# Patient Record
Sex: Male | Born: 1970
Health system: Southern US, Community
[De-identification: ages and names within clinical notes are randomized; demographics above are authoritative.]

## PROBLEM LIST (undated history)

## (undated) DIAGNOSIS — E119 Type 2 diabetes mellitus without complications: Secondary | ICD-10-CM

## (undated) DIAGNOSIS — J302 Other seasonal allergic rhinitis: Secondary | ICD-10-CM

## (undated) DIAGNOSIS — J939 Pneumothorax, unspecified: Secondary | ICD-10-CM

## (undated) DIAGNOSIS — K219 Gastro-esophageal reflux disease without esophagitis: Secondary | ICD-10-CM

## (undated) HISTORY — PX: CYST EXCISION: SHX5701

## (undated) HISTORY — DX: Type 2 diabetes mellitus without complications: E11.9

## (undated) HISTORY — DX: Pneumothorax, unspecified: J93.9

---

## 2015-04-10 ENCOUNTER — Institutional Professional Consult (permissible substitution) (INDEPENDENT_AMBULATORY_CARE_PROVIDER_SITE_OTHER): Payer: Self-pay | Admitting: Cardiothoracic Surgery

## 2015-04-10 ENCOUNTER — Other Ambulatory Visit: Payer: Self-pay | Admitting: Cardiothoracic Surgery

## 2015-04-10 ENCOUNTER — Other Ambulatory Visit: Payer: Self-pay | Admitting: *Deleted

## 2015-04-10 ENCOUNTER — Ambulatory Visit
Admission: RE | Admit: 2015-04-10 | Discharge: 2015-04-10 | Disposition: A | Discharge status: Home / Self Care | Payer: No Typology Code available for payment source | Source: Ambulatory Visit | Attending: Cardiothoracic Surgery | Admitting: Cardiothoracic Surgery

## 2015-04-10 ENCOUNTER — Encounter: Payer: Self-pay | Admitting: Cardiothoracic Surgery

## 2015-04-10 VITALS — BP 140/87 | HR 62 | Resp 20 | Ht 70.0 in | Wt 205.0 lb

## 2015-04-10 DIAGNOSIS — E119 Type 2 diabetes mellitus without complications: Secondary | ICD-10-CM | POA: Insufficient documentation

## 2015-04-10 DIAGNOSIS — J439 Emphysema, unspecified: Secondary | ICD-10-CM

## 2015-04-10 DIAGNOSIS — J9383 Other pneumothorax: Secondary | ICD-10-CM

## 2015-04-10 DIAGNOSIS — J939 Pneumothorax, unspecified: Secondary | ICD-10-CM

## 2015-04-10 MED FILL — ALPRAZolam 1 MG TABS: 1 | 6 days supply | Qty: 12 | Fill #0

## 2015-04-10 NOTE — Progress Notes (Signed)
PCP is Renae Fickle, MD Referring Provider is Marcellus Scott, MD  Chief Complaint  Patient presents with  . Spontaneous Pneumothorax    eval and treat.Marland KitchenMarland KitchenCXR today   Patient examined, recent chest x-rays and CT scan of the chest performed last month personal reviewed and counseled with patient  HPI:45 year old Caucasian male reformed smoker ( quit smoking one pack per day 10 years ago) presents for evaluation of a subacute left pneumothorax. The patient started having left chest sensation of air bubbles /chest discomfort with cough and some exertional shortness of breath. This occurred last fall. Treated with courses of oral antibiotics without improvement. He returned with persistent symptoms and a chest x-ray was performed at an urgent care in January. This showed a left 15-20% pneumothorax. CT scan of the chest was then performed which showed blebs in the lingula of the left upper lobe and some bleb formation in the basilar segment of left lower lobe with a corresponding 15% pneumothorax.mild COPD changes. No discrete pulmonary mass. No abnormal adenopathy. 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 He states several months prior to the onset of symptoms he was cutting down a tree which backlashed him to the ground and  a sidebranch struck him across the back of the neck and shoulder.he did not seek medical care and was back to work 2 days later.  Reviewing the patient's x-rays he has had a persistent 15-20% left pneumothorax for several weeks without improvement, probably related to blebs in the lingula of the left upper lobe which are persistently symptomatic and should be surgically resected.  The patient has had no significant medical issues and takes no medications. Past Medical History  Diagnosis Date  . Diabetes mellitus without complication (HCC)     diet controlled  .  Pneumothorax     Past Surgical History  Procedure Laterality Date  . Cyst excision Right     right upper arm    Family History  Problem Relation Age of Onset  . Cancer Mother     kidney  . Diabetes Father   . Hypertension Father     Social History Social History  Substance Use Topics  . Smoking status: Former Smoker -- 1.00 packs/day for 20 years    Types: Cigarettes    Quit date: 04/09/2005  . Smokeless tobacco: None  . Alcohol Use: No    Current Outpatient Prescriptions  Medication Sig Dispense Refill  . albuterol (PROVENTIL HFA;VENTOLIN HFA) 108 (90 Base) MCG/ACT inhaler Inhale 2 puffs into the lungs every 4 (four) hours as needed for wheezing or shortness of breath.    . fluticasone (FLONASE) 50 MCG/ACT nasal spray Place into both nostrils 2 (two) times daily.     No current facility-administered medications for this visit.    Allergies  Allergen Reactions  . Prednisone Rash    Review of Systems         Review of Systems :  [ y ] = yes, [  ] = no        General :  Weight gain [   ]    Weight loss  [   ]  Fatigue [  ]  Fever [  ]  Chills  [  ]  Weakness  [  ]           Cardiac :  Chest pain/ pressure [ yes left-sided with exertion ]  Resting SOB [  ] exertional SOB [ y ES]                        Orthopnea [  ]  Pedal edema  [  ]  Palpitations [  ] Syncope/presyncope                         Paroxysmal nocturnal dyspnea [  ]        Pulmonary : cough [  ]  wheezing [  ]  Hemoptysis [  ] Sputum [  ] Snoring [  ]                              Pneumothorax [  ]  Sleep apnea [  ]       GI : Vomiting [  ]  Dysphagia [  ]  Melena  [  ]  Abdominal pain [  ] BRBPR [  ]              Heart burn [  ]  Constipation [  ] Diarrhea  [  ] Colonoscopy [  ]       GU : Hematuria [  ]  Dysuria [  ]  Nocturia [  ] UTI's [  ]       Vascular : Claudication [  ]  Rest pain [  ]  DVT [  ] Vein stripping [  ] leg ulcers [  ]                          TIA [   ] Stroke [  ]  Varicose veins [  ]       NEURO :  Headaches  [  ] Seizures [  ] Vision changes [  ] Paresthesias [  ]       Musculoskeletal :  Arthritis [  ] Gout  [  ]  Back pain [  ]  Joint pain [  ]       Skin :  Rash [  ]  Melanoma [  ]        Heme : Bleeding problems [  ]Clotting Disorders [  ] Anemia [  ]Blood Transfusion        Endocrine : Diabetes [  ] Thyroid Disorder  [  ]       Psych : Depression [  ]  Anxiety [  ]  Psych hospitalizations [  ]                                              BP 140/87 mmHg  Pulse 62  Resp 20  Ht  (1.778 m)  Wt 205 lb (92.987 kg)  BMI 29.41 kg/m2  SpO2 98% Physical Exam      Physical Exam  General: alert and pleasant well nourished middle-aged Caucasian male no acute distress HEENT: Normocephalic pupils equal , dentition adequate Neck: Supple without JVD, adenopathy, or bruit Chest: left pleural friction rub easily heard  in the left anterior axillary line the without tenderness or deformity         Cardiovascular: Regular rate and rhythm, no murmur, no gallop, peripheral pulses             palpable in all extremities Abdomen:  Soft, nontender, no palpable mass or organomegaly Extremities: Warm, well-perfused, no clubbing cyanosis edema or tenderness,              no venous stasis changes of the legs Rectal/GU: Deferred Neuro: Grossly non--focal and symmetrical throughout Skin: Clean and dry without rash or ulceration   Diagnostic Tests: CT scan of chest showing the bleb disease in the left upper lobe and left lower lobe discussed and demonstrated to the patient and his son  Impression: Subacute left spontaneous pneumothorax, persistent over several weeks with persistent symptoms.  Plan:left VATS with resection of blebs and pleurodesis is indicated. This will be scheduled for surgery at [facility] next week. I discussed the procedure in detail the patient including indications benefits alternatives and risks. He agrees  to proceed.   Taylor Bussing, MD [facility] [phone]

## 2015-04-14 ENCOUNTER — Other Ambulatory Visit (HOSPITAL_COMMUNITY): Payer: Self-pay | Admitting: *Deleted

## 2015-04-14 NOTE — Pre-Procedure Instructions (Signed)
Taylor [name]  04/14/2015      [name] [name] [place] - [address] [place] [address] Phone: [phone] Fax: [phone]    Your procedure is scheduled on Thursday, April 16, 2015 at 7:30 AM.   Report to [facility] Entrance "A" Admitting Office at 5:30 AM.   Call this number if you have problems the morning of surgery: [phone]    Remember:  Do not eat food or drink liquids after midnight tonight.    Do not wear jewelry.  Do not wear lotions, powders, or cologne.  You may NOT wear deodorant.  Men may shave face and neck.  Do not bring valuables to the hospital.  [facility] is not responsible for any belongings or valuables.  Contacts, dentures or bridgework may not be worn into surgery.  Leave your suitcase in the car.  After surgery it may be brought to your room.  For patients admitted to the hospital, discharge time will be determined by your treatment team.  Special instructions:  [facility] - Preparing for Surgery  Before surgery, you can play an important role.  Because skin is not sterile, your skin needs to be as free of germs as possible.  You can reduce the number of germs on you skin by washing with CHG (chlorahexidine gluconate) soap before surgery.  CHG is an antiseptic cleaner which kills germs and bonds with the skin to continue killing germs even after washing.  Please DO NOT use if you have an allergy to CHG or antibacterial soaps.  If your skin becomes reddened/irritated stop using the CHG and inform your nurse when you arrive at Short Stay.  Do not shave (including legs and underarms) for at least 48 hours prior to the first CHG shower.  You may shave your face.  Please follow these instructions carefully:   1.  Shower with CHG Soap the night before surgery and the                                morning of Surgery.  2.  If you choose to wash your hair, wash your hair first as usual  with your       normal shampoo.  3.  After you shampoo, rinse your hair and body thoroughly to remove the                      Shampoo.  4.  Use CHG as you would any other liquid soap.  You can apply chg directly       to the skin and wash gently with scrungie or a clean washcloth.  5.  Apply the CHG Soap to your body ONLY FROM THE NECK DOWN.        Do not use on open wounds or open sores.  Avoid contact with your eyes, ears, mouth and genitals (private parts).  Wash genitals (private parts) with your normal soap.  6.  Wash thoroughly, paying special attention to the area where your surgery        will be performed.  7.  Thoroughly rinse your body with warm water from the neck down.  8.  DO NOT shower/wash with your normal soap after using and rinsing off       the CHG Soap.  9.  Pat yourself dry with a clean  towel.            10.  Wear clean pajamas.            11.  Place clean sheets on your bed the night of your first shower and do not        sleep with pets.  Day of Surgery  Do not apply any lotions/deodorants the morning of surgery.  Please wear clean clothes to the hospital.   Please read over the following fact sheets that you were given. Pain Booklet, Coughing and Deep Breathing, Blood Transfusion Information, MRSA Information and Surgical Site Infection Prevention

## 2015-04-15 ENCOUNTER — Encounter (HOSPITAL_COMMUNITY)
Admission: RE | Admit: 2015-04-15 | Discharge: 2015-04-15 | Disposition: A | Discharge status: Home / Self Care | Payer: Self-pay | Source: Ambulatory Visit | Attending: Cardiothoracic Surgery | Admitting: Cardiothoracic Surgery

## 2015-04-15 ENCOUNTER — Ambulatory Visit (HOSPITAL_COMMUNITY)
Admission: RE | Admit: 2015-04-15 | Discharge: 2015-04-15 | Disposition: A | Discharge status: Home / Self Care | Payer: Self-pay | Source: Ambulatory Visit | Attending: Cardiothoracic Surgery | Admitting: Cardiothoracic Surgery

## 2015-04-15 ENCOUNTER — Encounter (HOSPITAL_COMMUNITY): Payer: Self-pay

## 2015-04-15 VITALS — BP 127/76 | HR 74 | Temp 98.6°F | Resp 20 | Ht 70.0 in | Wt 205.9 lb

## 2015-04-15 DIAGNOSIS — Z01812 Encounter for preprocedural laboratory examination: Secondary | ICD-10-CM | POA: Insufficient documentation

## 2015-04-15 DIAGNOSIS — J939 Pneumothorax, unspecified: Secondary | ICD-10-CM

## 2015-04-15 DIAGNOSIS — Z01810 Encounter for preprocedural cardiovascular examination: Secondary | ICD-10-CM | POA: Insufficient documentation

## 2015-04-15 DIAGNOSIS — Z01818 Encounter for other preprocedural examination: Secondary | ICD-10-CM | POA: Insufficient documentation

## 2015-04-15 DIAGNOSIS — R001 Bradycardia, unspecified: Secondary | ICD-10-CM | POA: Insufficient documentation

## 2015-04-15 DIAGNOSIS — J439 Emphysema, unspecified: Secondary | ICD-10-CM

## 2015-04-15 HISTORY — DX: Gastro-esophageal reflux disease without esophagitis: K21.9

## 2015-04-15 HISTORY — DX: Other seasonal allergic rhinitis: J30.2

## 2015-04-15 LAB — COMPREHENSIVE METABOLIC PANEL WITH GFR
ALT: 28 U/L (ref 17–63)
AST: 22 U/L (ref 15–41)
Albumin: 3.9 g/dL (ref 3.5–5.0)
Alkaline Phosphatase: 95 U/L (ref 38–126)
Anion gap: 10 (ref 5–15)
BUN: 14 mg/dL (ref 6–20)
CO2: 20 mmol/L — ABNORMAL LOW (ref 22–32)
Calcium: 8.9 mg/dL (ref 8.9–10.3)
Chloride: 106 mmol/L (ref 101–111)
Creatinine, Ser: 0.92 mg/dL (ref 0.61–1.24)
GFR calc Af Amer: >60 mL/min
GFR calc non Af Amer: >60 mL/min
Glucose, Bld: 231 mg/dL — ABNORMAL HIGH (ref 65–99)
Potassium: 4.3 mmol/L (ref 3.5–5.1)
Sodium: 136 mmol/L (ref 135–145)
Total Bilirubin: 0.8 mg/dL (ref 0.3–1.2)
Total Protein: 7.3 g/dL (ref 6.5–8.1)

## 2015-04-15 LAB — BLOOD GAS, ARTERIAL
Acid-base deficit: 2.2 mmol/L — ABNORMAL HIGH (ref 0.0–2.0)
Bicarbonate: 21.7 mEq/L (ref 20.0–24.0)
O2 Saturation: 96.7 %
Patient temperature: 98.6
TCO2: 22.8 mmol/L (ref 0–100)
pCO2 arterial: 35 mmHg (ref 35.0–45.0)
pH, Arterial: 7.409 (ref 7.350–7.450)
pO2, Arterial: 88.9 mmHg (ref 80.0–100.0)

## 2015-04-15 LAB — TYPE AND SCREEN
ABO/RH(D): A POS
Antibody Screen: NEGATIVE

## 2015-04-15 LAB — PROTIME-INR
INR: 1.04 (ref 0.00–1.49)
Prothrombin Time: 13.8 seconds (ref 11.6–15.2)

## 2015-04-15 LAB — APTT: aPTT: 29 s (ref 24–37)

## 2015-04-15 LAB — URINALYSIS, ROUTINE W REFLEX MICROSCOPIC
Bilirubin Urine: NEGATIVE
Glucose, UA: >1000 mg/dL — AB
Hgb urine dipstick: NEGATIVE
Ketones, ur: NEGATIVE mg/dL
Leukocytes, UA: NEGATIVE
Nitrite: NEGATIVE
Protein, ur: NEGATIVE mg/dL
Specific Gravity, Urine: 1.029 (ref 1.005–1.030)
pH: 5.5 (ref 5.0–8.0)

## 2015-04-15 LAB — URINE MICROSCOPIC-ADD ON
Bacteria, UA: NONE SEEN
RBC / HPF: NONE SEEN RBC/hpf (ref 0–5)
Squamous Epithelial / LPF: NONE SEEN
WBC, UA: NONE SEEN WBC/hpf (ref 0–5)

## 2015-04-15 LAB — CBC
HCT: 45.8 % (ref 39.0–52.0)
Hemoglobin: 15.5 g/dL (ref 13.0–17.0)
MCH: 28.6 pg (ref 26.0–34.0)
MCHC: 33.8 g/dL (ref 30.0–36.0)
MCV: 84.5 fL (ref 78.0–100.0)
Platelets: 278 10*3/uL (ref 150–400)
RBC: 5.42 MIL/uL (ref 4.22–5.81)
RDW: 12.7 % (ref 11.5–15.5)
WBC: 7.8 10*3/uL (ref 4.0–10.5)

## 2015-04-15 LAB — GLUCOSE, CAPILLARY: Glucose-Capillary: 224 mg/dL — ABNORMAL HIGH (ref 65–99)

## 2015-04-15 LAB — ABO/RH: ABO/RH(D): A POS

## 2015-04-15 LAB — SURGICAL PCR SCREEN
MRSA, PCR: NEGATIVE
Staphylococcus aureus: NEGATIVE

## 2015-04-15 MED ORDER — DEXTROSE 5 % IV SOLN
1.5000 g | INTRAVENOUS | Status: AC
  Administered 2015-04-16: 1.5 g via INTRAVENOUS
  Filled 2015-04-15 (×2): qty 1.5

## 2015-04-15 NOTE — Progress Notes (Addendum)
Patient's PCP is Renae Fickle, MD in [place].  Patient denies ever having any cardiac testing done or ever seeing a cardiologist for any reason.  Patient is a diabetic (type 2) but states it is diet controlled.  CBG in PAT was 224.  He states his sugars are normally in the 100s when he checks it first thing in the morning. A1C pending.

## 2015-04-15 NOTE — Progress Notes (Signed)
   04/15/15 0818  OBSTRUCTIVE SLEEP APNEA  Have you ever been diagnosed with sleep apnea through a sleep study? No  Do you snore loudly (loud enough to be heard through closed doors)?  1  Do you often feel tired, fatigued, or sleepy during the daytime (such as falling asleep during driving or talking to someone)? 1  Has anyone observed you stop breathing during your sleep? 1  Do you have, or are you being treated for high blood pressure? 0  BMI more than 35 kg/m2? 0  Age > 50 (1-yes) 0  Neck circumference greater than:Male 16 inches or larger, Male 17inches or larger? 1 (66.5)  Male Gender (Yes=1) 1  Obstructive Sleep Apnea Score 5  Score 5 or greater  Results sent to PCP

## 2015-04-16 ENCOUNTER — Inpatient Hospital Stay (HOSPITAL_COMMUNITY)
Admission: RE | Admit: 2015-04-16 | Discharge: 2015-04-19 | DRG: 164 | Disposition: A | Discharge status: Home / Self Care | Payer: Self-pay | Source: Ambulatory Visit | Attending: Cardiothoracic Surgery | Admitting: Cardiothoracic Surgery

## 2015-04-16 ENCOUNTER — Inpatient Hospital Stay (HOSPITAL_COMMUNITY): Payer: Self-pay | Admitting: Anesthesiology

## 2015-04-16 ENCOUNTER — Encounter (HOSPITAL_COMMUNITY)
Admission: RE | Disposition: A | Discharge status: Home / Self Care | Payer: Self-pay | Source: Ambulatory Visit | Attending: Cardiothoracic Surgery

## 2015-04-16 ENCOUNTER — Inpatient Hospital Stay (HOSPITAL_COMMUNITY): Payer: Self-pay

## 2015-04-16 ENCOUNTER — Encounter (HOSPITAL_COMMUNITY): Payer: Self-pay | Admitting: Certified Registered"

## 2015-04-16 DIAGNOSIS — J9383 Other pneumothorax: Principal | ICD-10-CM | POA: Diagnosis present

## 2015-04-16 DIAGNOSIS — J939 Pneumothorax, unspecified: Secondary | ICD-10-CM | POA: Diagnosis present

## 2015-04-16 DIAGNOSIS — R0602 Shortness of breath: Secondary | ICD-10-CM

## 2015-04-16 DIAGNOSIS — J9311 Primary spontaneous pneumothorax: Secondary | ICD-10-CM

## 2015-04-16 DIAGNOSIS — F419 Anxiety disorder, unspecified: Secondary | ICD-10-CM | POA: Diagnosis present

## 2015-04-16 DIAGNOSIS — J439 Emphysema, unspecified: Secondary | ICD-10-CM | POA: Diagnosis present

## 2015-04-16 DIAGNOSIS — J9811 Atelectasis: Secondary | ICD-10-CM | POA: Diagnosis not present

## 2015-04-16 DIAGNOSIS — J90 Pleural effusion, not elsewhere classified: Secondary | ICD-10-CM | POA: Diagnosis not present

## 2015-04-16 DIAGNOSIS — E119 Type 2 diabetes mellitus without complications: Secondary | ICD-10-CM | POA: Diagnosis present

## 2015-04-16 DIAGNOSIS — Z87891 Personal history of nicotine dependence: Secondary | ICD-10-CM

## 2015-04-16 DIAGNOSIS — R238 Other skin changes: Secondary | ICD-10-CM | POA: Diagnosis present

## 2015-04-16 HISTORY — PX: VIDEO ASSISTED THORACOSCOPY: SHX5073

## 2015-04-16 HISTORY — PX: TALC PLEURODESIS: SHX2506

## 2015-04-16 HISTORY — PX: STAPLING OF BLEBS: SHX6429

## 2015-04-16 LAB — GLUCOSE, CAPILLARY
Glucose-Capillary: 178 mg/dL — ABNORMAL HIGH (ref 65–99)
Glucose-Capillary: 189 mg/dL — ABNORMAL HIGH (ref 65–99)

## 2015-04-16 LAB — HEMOGLOBIN A1C
Hgb A1c MFr Bld: 8.2 % — ABNORMAL HIGH (ref 4.8–5.6)
Mean Plasma Glucose: 189 mg/dL

## 2015-04-16 SURGERY — VIDEO ASSISTED THORACOSCOPY
Anesthesia: General | Site: Chest | Laterality: Left

## 2015-04-16 MED ORDER — LIDOCAINE HCL (CARDIAC) 20 MG/ML IV SOLN
INTRAVENOUS | Status: AC
  Filled 2015-04-16: qty 5

## 2015-04-16 MED ORDER — SENNOSIDES-DOCUSATE SODIUM 8.6-50 MG PO TABS
1.0000 | ORAL_TABLET | Freq: Every day | ORAL | Status: DC
  Administered 2015-04-16 – 2015-04-18 (×3): 1 via ORAL
  Filled 2015-04-16 (×3): qty 1

## 2015-04-16 MED ORDER — MIDAZOLAM HCL 5 MG/5ML IJ SOLN
INTRAMUSCULAR | Status: DC | PRN
Start: 2015-04-16 — End: 2015-04-16
  Administered 2015-04-16: 2 mg via INTRAVENOUS

## 2015-04-16 MED ORDER — 0.9 % SODIUM CHLORIDE (POUR BTL) OPTIME
TOPICAL | Status: DC | PRN
  Administered 2015-04-16: 2000 mL

## 2015-04-16 MED ORDER — POTASSIUM CHLORIDE 10 MEQ/50ML IV SOLN: 10.0000 meq | Freq: Every day | INTRAVENOUS | Status: DC | PRN

## 2015-04-16 MED ORDER — PROMETHAZINE HCL 25 MG/ML IJ SOLN: 6.2500 mg | INTRAMUSCULAR | Status: DC | PRN

## 2015-04-16 MED ORDER — SUCCINYLCHOLINE CHLORIDE 20 MG/ML IJ SOLN
INTRAMUSCULAR | Status: AC
  Filled 2015-04-16: qty 1

## 2015-04-16 MED ORDER — INFLUENZA VAC SPLIT QUAD 0.5 ML IM SUSY
0.5000 mL | PREFILLED_SYRINGE | INTRAMUSCULAR | Status: DC
  Filled 2015-04-16 (×2): qty 0.5

## 2015-04-16 MED ORDER — LUNG SURGERY BOOK
Freq: Once | Status: AC
  Administered 2015-04-16: 1
  Filled 2015-04-16: qty 1

## 2015-04-16 MED ORDER — TRAMADOL HCL 50 MG PO TABS
50.0000 mg | ORAL_TABLET | Freq: Four times a day (QID) | ORAL | Status: DC | PRN
  Administered 2015-04-16 – 2015-04-17 (×2): 100 mg via ORAL
  Filled 2015-04-16: qty 2

## 2015-04-16 MED ORDER — EPHEDRINE SULFATE 50 MG/ML IJ SOLN
INTRAMUSCULAR | Status: DC | PRN
  Administered 2015-04-16 (×2): 10 mg via INTRAVENOUS

## 2015-04-16 MED ORDER — FENTANYL CITRATE (PF) 250 MCG/5ML IJ SOLN
INTRAMUSCULAR | Status: AC
  Filled 2015-04-16: qty 5

## 2015-04-16 MED ORDER — LIDOCAINE HCL (CARDIAC) 20 MG/ML IV SOLN
INTRAVENOUS | Status: DC | PRN
  Administered 2015-04-16: 80 mg via INTRAVENOUS

## 2015-04-16 MED ORDER — EPHEDRINE SULFATE 50 MG/ML IJ SOLN
INTRAMUSCULAR | Status: AC
  Filled 2015-04-16: qty 1

## 2015-04-16 MED ORDER — KETOROLAC TROMETHAMINE 30 MG/ML IJ SOLN
INTRAMUSCULAR | Status: AC
  Administered 2015-04-16: 30 mg via INTRAVENOUS
  Filled 2015-04-16: qty 1

## 2015-04-16 MED ORDER — DIPHENHYDRAMINE HCL 50 MG/ML IJ SOLN: 12.5000 mg | Freq: Four times a day (QID) | INTRAMUSCULAR | Status: DC | PRN

## 2015-04-16 MED ORDER — MIDAZOLAM HCL 2 MG/2ML IJ SOLN
INTRAMUSCULAR | Status: AC
  Filled 2015-04-16: qty 2

## 2015-04-16 MED ORDER — NALOXONE HCL 0.4 MG/ML IJ SOLN: 0.4000 mg | INTRAMUSCULAR | Status: DC | PRN

## 2015-04-16 MED ORDER — HYDROMORPHONE HCL 1 MG/ML IJ SOLN
INTRAMUSCULAR | Status: AC
  Filled 2015-04-16: qty 1

## 2015-04-16 MED ORDER — KETOROLAC TROMETHAMINE 30 MG/ML IJ SOLN
30.0000 mg | Freq: Once | INTRAMUSCULAR | Status: AC
  Administered 2015-04-16: 30 mg via INTRAVENOUS

## 2015-04-16 MED ORDER — DIPHENHYDRAMINE HCL 12.5 MG/5ML PO ELIX
12.5000 mg | ORAL_SOLUTION | Freq: Four times a day (QID) | ORAL | Status: DC | PRN
  Administered 2015-04-17: 12.5 mg via ORAL
  Filled 2015-04-16: qty 5

## 2015-04-16 MED ORDER — ROCURONIUM BROMIDE 100 MG/10ML IV SOLN
INTRAVENOUS | Status: DC | PRN
  Administered 2015-04-16: 10 mg via INTRAVENOUS
  Administered 2015-04-16: 30 mg via INTRAVENOUS
  Administered 2015-04-16: 40 mg via INTRAVENOUS
  Administered 2015-04-16: 10 mg via INTRAVENOUS

## 2015-04-16 MED ORDER — ONDANSETRON HCL 4 MG/2ML IJ SOLN
4.0000 mg | Freq: Four times a day (QID) | INTRAMUSCULAR | Status: DC | PRN
  Administered 2015-04-17: 4 mg via INTRAVENOUS
  Filled 2015-04-16: qty 2

## 2015-04-16 MED ORDER — PROPOFOL 10 MG/ML IV BOLUS
INTRAVENOUS | Status: AC
  Filled 2015-04-16: qty 20

## 2015-04-16 MED ORDER — ROCURONIUM BROMIDE 50 MG/5ML IV SOLN
INTRAVENOUS | Status: AC
  Filled 2015-04-16: qty 1

## 2015-04-16 MED ORDER — ACETAMINOPHEN 160 MG/5ML PO SOLN: 1000.0000 mg | Freq: Four times a day (QID) | ORAL | Status: DC

## 2015-04-16 MED ORDER — ALBUTEROL SULFATE (2.5 MG/3ML) 0.083% IN NEBU
2.5000 mg | INHALATION_SOLUTION | RESPIRATORY_TRACT | Status: DC
  Administered 2015-04-16 (×2): 2.5 mg via RESPIRATORY_TRACT
  Filled 2015-04-16 (×2): qty 3

## 2015-04-16 MED ORDER — HEMOSTATIC AGENTS (NO CHARGE) OPTIME
TOPICAL | Status: DC | PRN
  Administered 2015-04-16: 1 via TOPICAL

## 2015-04-16 MED ORDER — ONDANSETRON HCL 4 MG/2ML IJ SOLN
INTRAMUSCULAR | Status: DC | PRN
  Administered 2015-04-16: 4 mg via INTRAVENOUS

## 2015-04-16 MED ORDER — TALC 5 G PL SUSR
INTRAPLEURAL | Status: DC | PRN
  Administered 2015-04-16: 5 g via INTRAPLEURAL

## 2015-04-16 MED ORDER — ALBUTEROL SULFATE (2.5 MG/3ML) 0.083% IN NEBU
2.5000 mg | INHALATION_SOLUTION | RESPIRATORY_TRACT | Status: DC
  Administered 2015-04-17: 2.5 mg via RESPIRATORY_TRACT
  Filled 2015-04-16: qty 3

## 2015-04-16 MED ORDER — OXYCODONE HCL 5 MG PO TABS
5.0000 mg | ORAL_TABLET | ORAL | Status: DC | PRN
  Administered 2015-04-16: 10 mg via ORAL
  Administered 2015-04-19: 5 mg via ORAL
  Filled 2015-04-16: qty 1

## 2015-04-16 MED ORDER — KETOROLAC TROMETHAMINE 30 MG/ML IJ SOLN
30.0000 mg | Freq: Four times a day (QID) | INTRAMUSCULAR | Status: AC
  Administered 2015-04-16 – 2015-04-17 (×4): 30 mg via INTRAVENOUS
  Filled 2015-04-16 (×4): qty 1

## 2015-04-16 MED ORDER — ONDANSETRON HCL 4 MG/2ML IJ SOLN: 4.0000 mg | Freq: Four times a day (QID) | INTRAMUSCULAR | Status: DC | PRN

## 2015-04-16 MED ORDER — SUGAMMADEX SODIUM 200 MG/2ML IV SOLN
INTRAVENOUS | Status: DC | PRN
  Administered 2015-04-16: 200 mg via INTRAVENOUS

## 2015-04-16 MED ORDER — DEXTROSE-NACL 5-0.9 % IV SOLN
INTRAVENOUS | Status: DC
  Administered 2015-04-16 – 2015-04-17 (×2): via INTRAVENOUS

## 2015-04-16 MED ORDER — FENTANYL 40 MCG/ML IV SOLN
INTRAVENOUS | Status: AC
  Filled 2015-04-16: qty 25

## 2015-04-16 MED ORDER — PNEUMOCOCCAL VAC POLYVALENT 25 MCG/0.5ML IJ INJ
0.5000 mL | INJECTION | INTRAMUSCULAR | Status: DC
  Filled 2015-04-16 (×2): qty 0.5

## 2015-04-16 MED ORDER — FENTANYL 40 MCG/ML IV SOLN
INTRAVENOUS | Status: DC
  Administered 2015-04-16: 230 ug via INTRAVENOUS
  Administered 2015-04-16: 11:00:00 via INTRAVENOUS
  Administered 2015-04-16: 15 ug via INTRAVENOUS
  Administered 2015-04-16: 180 ug via INTRAVENOUS
  Administered 2015-04-17: 150 ug via INTRAVENOUS
  Administered 2015-04-17: 360 ug via INTRAVENOUS
  Administered 2015-04-17: 210 ug via INTRAVENOUS
  Administered 2015-04-17: 10:00:00 via INTRAVENOUS
  Administered 2015-04-17: 75 ug via INTRAVENOUS
  Administered 2015-04-18: 60 ug via INTRAVENOUS
  Administered 2015-04-18 (×3): 120 ug via INTRAVENOUS
  Administered 2015-04-19: 195 ug via INTRAVENOUS
  Administered 2015-04-19: 30 ug via INTRAVENOUS
  Administered 2015-04-19: 195 ug via INTRAVENOUS
  Filled 2015-04-16 (×2): qty 25

## 2015-04-16 MED ORDER — HYDROMORPHONE HCL 1 MG/ML IJ SOLN
0.5000 mg | INTRAMUSCULAR | Status: AC | PRN
  Administered 2015-04-16 (×4): 0.5 mg via INTRAVENOUS

## 2015-04-16 MED ORDER — DEXTROSE 5 % IV SOLN
1.5000 g | Freq: Two times a day (BID) | INTRAVENOUS | Status: AC
  Administered 2015-04-16 – 2015-04-17 (×2): 1.5 g via INTRAVENOUS
  Filled 2015-04-16 (×2): qty 1.5

## 2015-04-16 MED ORDER — FENTANYL CITRATE (PF) 100 MCG/2ML IJ SOLN
INTRAMUSCULAR | Status: AC
  Administered 2015-04-16: 50 ug via INTRAVENOUS
  Filled 2015-04-16: qty 2

## 2015-04-16 MED ORDER — TALC 5 G PL SUSR
INTRAPLEURAL | Status: AC
  Filled 2015-04-16: qty 5

## 2015-04-16 MED ORDER — SODIUM CHLORIDE 0.9% FLUSH: 9.0000 mL | INTRAVENOUS | Status: DC | PRN

## 2015-04-16 MED ORDER — OXYCODONE HCL 5 MG PO TABS
ORAL_TABLET | ORAL | Status: AC
  Filled 2015-04-16: qty 2

## 2015-04-16 MED ORDER — ACETAMINOPHEN 500 MG PO TABS
1000.0000 mg | ORAL_TABLET | Freq: Four times a day (QID) | ORAL | Status: DC
  Administered 2015-04-16 – 2015-04-19 (×11): 1000 mg via ORAL
  Filled 2015-04-16 (×11): qty 2

## 2015-04-16 MED ORDER — FENTANYL CITRATE (PF) 100 MCG/2ML IJ SOLN
25.0000 ug | INTRAMUSCULAR | Status: DC | PRN
  Administered 2015-04-16 (×3): 50 ug via INTRAVENOUS

## 2015-04-16 MED ORDER — LACTATED RINGERS IV SOLN
INTRAVENOUS | Status: DC | PRN
  Administered 2015-04-16: 08:00:00 via INTRAVENOUS

## 2015-04-16 MED ORDER — FENTANYL CITRATE (PF) 100 MCG/2ML IJ SOLN
INTRAMUSCULAR | Status: DC | PRN
  Administered 2015-04-16 (×2): 50 ug via INTRAVENOUS
  Administered 2015-04-16: 200 ug via INTRAVENOUS
  Administered 2015-04-16 (×2): 50 ug via INTRAVENOUS

## 2015-04-16 MED ORDER — MEPERIDINE HCL 25 MG/ML IJ SOLN: 6.2500 mg | INTRAMUSCULAR | Status: DC | PRN

## 2015-04-16 MED ORDER — PROPOFOL 10 MG/ML IV BOLUS
INTRAVENOUS | Status: DC | PRN
Start: 2015-04-16 — End: 2015-04-16
  Administered 2015-04-16: 200 mg via INTRAVENOUS

## 2015-04-16 MED ORDER — TRAMADOL HCL 50 MG PO TABS
ORAL_TABLET | ORAL | Status: AC
  Filled 2015-04-16: qty 2

## 2015-04-16 MED ORDER — BISACODYL 5 MG PO TBEC
10.0000 mg | DELAYED_RELEASE_TABLET | Freq: Every day | ORAL | Status: DC
  Administered 2015-04-17 – 2015-04-18 (×2): 10 mg via ORAL
  Filled 2015-04-16 (×3): qty 2

## 2015-04-16 MED ORDER — LACTATED RINGERS IV SOLN: INTRAVENOUS | Status: DC

## 2015-04-16 MED ORDER — FENTANYL CITRATE (PF) 100 MCG/2ML IJ SOLN
INTRAMUSCULAR | Status: AC
  Filled 2015-04-16: qty 2

## 2015-04-16 MED ORDER — SODIUM CHLORIDE 0.9 % IJ SOLN
INTRAMUSCULAR | Status: AC
  Filled 2015-04-16: qty 10

## 2015-04-16 MED ORDER — PHENYLEPHRINE 40 MCG/ML (10ML) SYRINGE FOR IV PUSH (FOR BLOOD PRESSURE SUPPORT)
PREFILLED_SYRINGE | INTRAVENOUS | Status: AC
  Filled 2015-04-16: qty 10

## 2015-04-16 MED ORDER — HYDROMORPHONE HCL 1 MG/ML IJ SOLN
INTRAMUSCULAR | Status: AC
  Administered 2015-04-16: 0.5 mg via INTRAVENOUS
  Filled 2015-04-16: qty 2

## 2015-04-16 SURGICAL SUPPLY — 67 items
APPLICATOR TIP EXT COSEAL (VASCULAR PRODUCTS) ×4 IMPLANT
BAG DECANTER FOR FLEXI CONT (MISCELLANEOUS) IMPLANT
BLADE SURG 11 STRL SS (BLADE) ×4 IMPLANT
CANISTER SUCTION 2500CC (MISCELLANEOUS) ×4 IMPLANT
CATH KIT ON Q 5IN SLV (PAIN MANAGEMENT) IMPLANT
CATH ROBINSON RED A/P 22FR (CATHETERS) IMPLANT
CATH THORACIC 28FR (CATHETERS) ×4 IMPLANT
CATH THORACIC 36FR (CATHETERS) IMPLANT
CATH THORACIC 36FR RT ANG (CATHETERS) IMPLANT
CLEANER TIP ELECTROSURG 2X2 (MISCELLANEOUS) ×4 IMPLANT
CONT SPEC 4OZ CLIKSEAL STRL BL (MISCELLANEOUS) ×8 IMPLANT
COVER SURGICAL LIGHT HANDLE (MISCELLANEOUS) ×8 IMPLANT
DERMABOND ADVANCED (GAUZE/BANDAGES/DRESSINGS) ×2
DERMABOND ADVANCED .7 DNX12 (GAUZE/BANDAGES/DRESSINGS) ×2 IMPLANT
DRAPE LAPAROSCOPIC ABDOMINAL (DRAPES) ×4 IMPLANT
DRAPE WARM FLUID 44X44 (DRAPE) ×4 IMPLANT
ELECT REM PT RETURN 9FT ADLT (ELECTROSURGICAL) ×4
ELECTRODE REM PT RTRN 9FT ADLT (ELECTROSURGICAL) ×2 IMPLANT
GAUZE SPONGE 4X4 12PLY STRL (GAUZE/BANDAGES/DRESSINGS) ×4 IMPLANT
GLOVE BIO SURGEON STRL SZ7 (GLOVE) ×12 IMPLANT
GLOVE BIO SURGEON STRL SZ7.5 (GLOVE) ×8 IMPLANT
GOWN STRL REUS W/ TWL LRG LVL3 (GOWN DISPOSABLE) ×6 IMPLANT
GOWN STRL REUS W/TWL LRG LVL3 (GOWN DISPOSABLE) ×6
KIT BASIN OR (CUSTOM PROCEDURE TRAY) ×4 IMPLANT
KIT ROOM TURNOVER OR (KITS) ×4 IMPLANT
KIT SUCTION CATH 14FR (SUCTIONS) ×4 IMPLANT
NS IRRIG 1000ML POUR BTL (IV SOLUTION) ×8 IMPLANT
PACK CHEST (CUSTOM PROCEDURE TRAY) ×4 IMPLANT
PAD ARMBOARD 7.5X6 YLW CONV (MISCELLANEOUS) ×8 IMPLANT
RELOAD STAPLER GOLD 60MM (STAPLE) ×6 IMPLANT
SEALANT SURG COSEAL 4ML (VASCULAR PRODUCTS) IMPLANT
SEALANT SURG COSEAL 8ML (VASCULAR PRODUCTS) ×4 IMPLANT
SOLUTION ANTI FOG 6CC (MISCELLANEOUS) ×4 IMPLANT
SPONGE GAUZE 4X4 12PLY STER LF (GAUZE/BANDAGES/DRESSINGS) ×4 IMPLANT
SPONGE TONSIL 1 RF SGL (DISPOSABLE) ×4 IMPLANT
STAPLE ECHEON FLEX 60 POW ENDO (STAPLE) ×4 IMPLANT
STAPLER RELOAD GOLD 60MM (STAPLE) ×12
SUT CHROMIC 3 0 SH 27 (SUTURE) IMPLANT
SUT ETHILON 3 0 PS 1 (SUTURE) IMPLANT
SUT PROLENE 3 0 SH DA (SUTURE) IMPLANT
SUT PROLENE 4 0 RB 1 (SUTURE)
SUT PROLENE 4-0 RB1 .5 CRCL 36 (SUTURE) IMPLANT
SUT SILK  1 MH (SUTURE) ×4
SUT SILK 1 MH (SUTURE) ×4 IMPLANT
SUT SILK 2 0SH CR/8 30 (SUTURE) IMPLANT
SUT SILK 3 0SH CR/8 30 (SUTURE) IMPLANT
SUT VIC AB 1 CTX 18 (SUTURE) IMPLANT
SUT VIC AB 2 TP1 27 (SUTURE) IMPLANT
SUT VIC AB 2-0 CT1 27 (SUTURE) ×4
SUT VIC AB 2-0 CT1 TAPERPNT 27 (SUTURE) ×4 IMPLANT
SUT VIC AB 2-0 CT2 18 VCP726D (SUTURE) IMPLANT
SUT VIC AB 2-0 CTX 36 (SUTURE) IMPLANT
SUT VIC AB 3-0 SH 18 (SUTURE) IMPLANT
SUT VIC AB 3-0 X1 27 (SUTURE) ×8 IMPLANT
SUT VICRYL 0 UR6 27IN ABS (SUTURE) ×8 IMPLANT
SUT VICRYL 2 TP 1 (SUTURE) IMPLANT
SWAB COLLECTION DEVICE MRSA (MISCELLANEOUS) IMPLANT
SYR BULB IRRIGATION 50ML (SYRINGE) ×4 IMPLANT
SYSTEM SAHARA CHEST DRAIN ATS (WOUND CARE) ×4 IMPLANT
TAPE CLOTH SURG 4X10 WHT LF (GAUZE/BANDAGES/DRESSINGS) ×4 IMPLANT
TIP APPLICATOR SPRAY EXTEND 16 (VASCULAR PRODUCTS) IMPLANT
TOWEL OR 17X24 6PK STRL BLUE (TOWEL DISPOSABLE) ×4 IMPLANT
TOWEL OR 17X26 10 PK STRL BLUE (TOWEL DISPOSABLE) ×8 IMPLANT
TRAP SPECIMEN MUCOUS 40CC (MISCELLANEOUS) IMPLANT
TRAY FOLEY CATH 16FRSI W/METER (SET/KITS/TRAYS/PACK) ×4 IMPLANT
TUBE ANAEROBIC SPECIMEN COL (MISCELLANEOUS) IMPLANT
WATER STERILE IRR 1000ML POUR (IV SOLUTION) ×8 IMPLANT

## 2015-04-16 NOTE — Brief Op Note (Signed)
04/16/2015  10:29 AM      [address]       [name] [address]             [phone]     04/16/2015  10:29 AM  PATIENT:  Taylor [name]  45 y.o. male  PRE-OPERATIVE DIAGNOSIS:  LT PTX BLEBS  POST-OPERATIVE DIAGNOSIS:  LT PTX BLEBS  PROCEDURE:  Procedure(s): VIDEO ASSISTED THORACOSCOPY BLEB RESECTION times two from upper lobe and one from lower lobe TALC PLEURADESIS and mechanical pleuradesis.  SURGEON:  Surgeon(s): Kerin Perna, MD  PHYSICIAN ASSISTANT: Jaquitta Dupriest PA-C   ANESTHESIA:   general  PATIENT CONDITION:  ICU - intubated and hemodynamically stable.  PRE-OPERATIVE WEIGHT: 92kg  COMPLICATIONS: NO KNOWN

## 2015-04-16 NOTE — Anesthesia Preprocedure Evaluation (Addendum)
Anesthesia Evaluation  Patient identified by MRN, date of birth, ID band Patient awake    Reviewed: Allergy & Precautions, NPO status , Patient's Chart, lab work & pertinent test results  Airway Mallampati: II  TM Distance: >3 FB Neck ROM: Full    Dental no notable dental hx.    Pulmonary former smoker,  L PTX   Pulmonary exam normal breath sounds clear to auscultation       Cardiovascular negative cardio ROS Normal cardiovascular exam Rhythm:Regular Rate:Normal     Neuro/Psych negative neurological ROS  negative psych ROS   GI/Hepatic negative GI ROS, Neg liver ROS,   Endo/Other  diabetes  Renal/GU negative Renal ROS  negative genitourinary   Musculoskeletal negative musculoskeletal ROS (+)   Abdominal   Peds negative pediatric ROS (+)  Hematology negative hematology ROS (+)   Anesthesia Other Findings   Reproductive/Obstetrics negative OB ROS                            Anesthesia Physical Anesthesia Plan  ASA: III  Anesthesia Plan: General   Post-op Pain Management:    Induction: Intravenous  Airway Management Planned: Double Lumen EBT  Additional Equipment: Arterial line  Intra-op Plan:   Post-operative Plan: Extubation in OR  Informed Consent: I have reviewed the patients History and Physical, chart, labs and discussed the procedure including the risks, benefits and alternatives for the proposed anesthesia with the patient or authorized representative who has indicated his/her understanding and acceptance.   Dental advisory given  Plan Discussed with: CRNA  Anesthesia Plan Comments:         Anesthesia Quick Evaluation

## 2015-04-16 NOTE — Anesthesia Postprocedure Evaluation (Signed)
Anesthesia Post Note  Patient: Taylor [name]  Procedure(s) Performed: Procedure(s) (LRB): VIDEO ASSISTED THORACOSCOPY (Left) BLEB RESECTION times two from upper lobe and one from lower lobe (Left) TALC PLEURADESIS and mechanical pleuradesis. (Left)  Patient location during evaluation: PACU Anesthesia Type: General Level of consciousness: awake and alert Pain management: pain level controlled Vital Signs Assessment: post-procedure vital signs reviewed and stable Respiratory status: spontaneous breathing, nonlabored ventilation, respiratory function stable and patient connected to nasal cannula oxygen Cardiovascular status: blood pressure returned to baseline and stable Postop Assessment: no signs of nausea or vomiting Anesthetic complications: no    Last Vitals:  Filed Vitals:   04/16/15 1125 04/16/15 1140  BP: 152/90 140/85  Pulse: 68 61  Temp:    Resp: 14 8    Last Pain:  Filed Vitals:   04/16/15 1147  PainSc: 7                  Phillips Grout

## 2015-04-16 NOTE — Transfer of Care (Signed)
Immediate Anesthesia Transfer of Care Note  Patient: Taylor [name]  Procedure(s) Performed: Procedure(s): VIDEO ASSISTED THORACOSCOPY (Left) BLEB RESECTION times two from upper lobe and one from lower lobe (Left) TALC PLEURADESIS and mechanical pleuradesis. (Left)  Patient Location: PACU  Anesthesia Type:General  Level of Consciousness: awake, alert , oriented, patient cooperative and responds to stimulation  Airway & Oxygen Therapy: Patient Spontanous Breathing and Patient connected to nasal cannula oxygen  Post-op Assessment: Report given to RN, Post -op Vital signs reviewed and stable and Patient moving all extremities X 4  Post vital signs: Reviewed and stable  Last Vitals:  Filed Vitals:   04/16/15 0711  BP: 158/101  Pulse: 61  Temp: 36.8 C  Resp: 20    Complications: No apparent anesthesia complications

## 2015-04-16 NOTE — Progress Notes (Signed)
The patient was examined and preop studies reviewed. There has been no change from the prior exam and the patient is ready for surgery.   Plan Left VATS for pneumothorax, bleb disease on M Ni[place]

## 2015-04-16 NOTE — Anesthesia Procedure Notes (Signed)
Procedure Name: Intubation Date/Time: 04/16/2015 8:35 AM Performed by: Rogelia Boga Pre-anesthesia Checklist: Patient identified, Emergency Drugs available, Suction available, Patient being monitored and Timeout performed Patient Re-evaluated:Patient Re-evaluated prior to inductionOxygen Delivery Method: Circle system utilized Intubation Type: IV induction Grade View: Grade II Tube type: Oral Endobronchial tube: Left, EBT position confirmed by fiberoptic bronchoscope, EBT position confirmed by auscultation and Double lumen EBT and 39 Fr Number of attempts: 1 Airway Equipment and Method: Bougie stylet Placement Confirmation: positive ETCO2,  breath sounds checked- equal and bilateral and ETT inserted through vocal cords under direct vision Secured at: 27 cm Tube secured with: Tape Dental Injury: Teeth and Oropharynx as per pre-operative assessment

## 2015-04-16 NOTE — Progress Notes (Signed)
Report given to jena rn as caregiver 

## 2015-04-17 ENCOUNTER — Encounter (HOSPITAL_COMMUNITY): Payer: Self-pay | Admitting: Cardiothoracic Surgery

## 2015-04-17 ENCOUNTER — Inpatient Hospital Stay (HOSPITAL_COMMUNITY): Payer: Self-pay

## 2015-04-17 LAB — BLOOD GAS, ARTERIAL
Acid-base deficit: 0.9 mmol/L (ref 0.0–2.0)
Bicarbonate: 23.4 mEq/L (ref 20.0–24.0)
FIO2: 0.28
O2 Saturation: 96.2 %
Patient temperature: 98.6
TCO2: 24.7 mmol/L (ref 0–100)
pCO2 arterial: 39.8 mmHg (ref 35.0–45.0)
pH, Arterial: 7.387 (ref 7.350–7.450)
pO2, Arterial: 80.7 mmHg (ref 80.0–100.0)

## 2015-04-17 LAB — CBC
HCT: 39.6 % (ref 39.0–52.0)
Hemoglobin: 13 g/dL (ref 13.0–17.0)
MCH: 28.3 pg (ref 26.0–34.0)
MCHC: 32.8 g/dL (ref 30.0–36.0)
MCV: 86.1 fL (ref 78.0–100.0)
Platelets: 267 10*3/uL (ref 150–400)
RBC: 4.6 MIL/uL (ref 4.22–5.81)
RDW: 12.7 % (ref 11.5–15.5)
WBC: 13.8 10*3/uL — ABNORMAL HIGH (ref 4.0–10.5)

## 2015-04-17 LAB — BASIC METABOLIC PANEL
Anion gap: 8 (ref 5–15)
BUN: 13 mg/dL (ref 6–20)
CO2: 25 mmol/L (ref 22–32)
Calcium: 8.2 mg/dL — ABNORMAL LOW (ref 8.9–10.3)
Chloride: 102 mmol/L (ref 101–111)
Creatinine, Ser: 1 mg/dL (ref 0.61–1.24)
GFR calc Af Amer: >60 mL/min (ref >60)
GFR calc non Af Amer: >60 mL/min (ref >60)
Glucose, Bld: 197 mg/dL — ABNORMAL HIGH (ref 65–99)
Potassium: 3.7 mmol/L (ref 3.5–5.1)
Sodium: 135 mmol/L (ref 135–145)

## 2015-04-17 LAB — GLUCOSE, CAPILLARY
Glucose-Capillary: 158 mg/dL — ABNORMAL HIGH (ref 65–99)
Glucose-Capillary: 176 mg/dL — ABNORMAL HIGH (ref 65–99)
Glucose-Capillary: 133 mg/dL — ABNORMAL HIGH (ref 65–99)

## 2015-04-17 MED ORDER — METOCLOPRAMIDE HCL 5 MG/ML IJ SOLN
10.0000 mg | Freq: Four times a day (QID) | INTRAMUSCULAR | Status: DC
  Administered 2015-04-17 – 2015-04-19 (×9): 10 mg via INTRAVENOUS
  Filled 2015-04-17 (×9): qty 2

## 2015-04-17 MED ORDER — ALBUTEROL SULFATE (2.5 MG/3ML) 0.083% IN NEBU: 2.5000 mg | INHALATION_SOLUTION | RESPIRATORY_TRACT | Status: DC | PRN

## 2015-04-17 MED ORDER — INSULIN ASPART 100 UNIT/ML ~~LOC~~ SOLN
4.0000 [IU] | Freq: Three times a day (TID) | SUBCUTANEOUS | Status: DC
  Administered 2015-04-17 – 2015-04-19 (×6): 4 [IU] via SUBCUTANEOUS

## 2015-04-17 MED ORDER — INSULIN ASPART 100 UNIT/ML ~~LOC~~ SOLN
0.0000 [IU] | Freq: Three times a day (TID) | SUBCUTANEOUS | Status: DC
  Administered 2015-04-17: 3 [IU] via SUBCUTANEOUS
  Administered 2015-04-17: 2 [IU] via SUBCUTANEOUS
  Administered 2015-04-18: 3 [IU] via SUBCUTANEOUS
  Administered 2015-04-18 (×2): 2 [IU] via SUBCUTANEOUS
  Administered 2015-04-19: 3 [IU] via SUBCUTANEOUS

## 2015-04-17 MED ORDER — INSULIN ASPART 100 UNIT/ML ~~LOC~~ SOLN: 0.0000 [IU] | Freq: Every day | SUBCUTANEOUS | Status: DC

## 2015-04-17 MED ORDER — FUROSEMIDE 10 MG/ML IJ SOLN
20.0000 mg | Freq: Once | INTRAMUSCULAR | Status: AC
  Administered 2015-04-17: 20 mg via INTRAVENOUS
  Filled 2015-04-17: qty 2

## 2015-04-17 NOTE — Discharge Summary (Signed)
Physician Discharge Summary       [address] [place]       [name] [address]             [phone]    Patient ID: Taylor [name] MRN: [id number] DOB/AGE: [birth year] 45 y.o.  Admit date: 04/16/2015 Discharge date: 04/19/2015  Admission Diagnoses: Spontaneous left pnumothorax  Active Diagnoses:  1. Tobacco abuse 2. Diabetes mellitus without complication (HCC)   Procedure (s):  Left VATS (video-assisted thoracoscopic surgery) with bleb resection of left upper lobe and left lower lobe, mechanical pleurodesis, talc pleurodesis and placement of left chest tube by Dr. Donata Clay on 04/16/2015.  Pathology:Results pending  History of Presenting Illness: This is a 45 year old Caucasian male reformed smoker  (quit smoking one pack per day 10 years ago) presents for evaluation of a subacute spontaneous left pneumothorax. The patient started having left chest sensation of air bubbles /chest discomfort with cough and some exertional shortness of breath. He was treated with courses of oral antibiotics without improvement. He returned with persistent symptoms and a chest x-ray was performed at an urgent care in January. This showed a left 15-20% pneumothorax. CT scan of the chest was then performed which showed blebs in the lingula of the left upper lobe and some bleb formation in the basilar segment of left lower lobe with a corresponding 15% pneumothorax.mild COPD changes. No discrete pulmonary mass. No abnormal adenopathy. 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 He states several months prior to the onset of symptoms he was cutting down a tree which backlashed him to the ground and a sidebranch struck him across the back of the neck and shoulder. He did not seek medical care and was back to work 2 days later.  Dr. Donata Clay reviewed the patient's x-rays. He has had a  persistent 15-20% left pneumothorax for several weeks without improvement, probably related to blebs in the lingula of the left upper lobe which are persistently symptomatic and should be surgically resected. Dr. Donata Clay discussed the need for left VATS, stapling of blebs, pleurodesis, and possible TALC. Potential risks, complications, and benefits were discussed with the patient and he agreed to proceed with surgery. He underwent the aforementioned surgery on 04/16/2015.   Brief Hospital Course:  A line and foley were removed early in his post operative course. Chest tube output gradually decreased and there was no air leak. Daily chest x rays were obtained and remained stable. Chest tube was placed to water seal on post operative day one. It was removed on 04/18/2015. He is ambulating on room air. He is tolerating a diet. His wounds are continuing to heal and there is no infection. He is felt surgically stable for discharge today.   Latest Vital Signs: Blood pressure 151/81, pulse 92, temperature 98.2 F (36.8 C), temperature source Oral, resp. rate 23, height  (1.778 m), weight 214 lb 11.7 oz (97.4 kg), SpO2 99 %.  Physical Exam: Cardiovascular: RRR Pulmonary: Coarse on right and clear on left. Abdomen: Soft, non tender, bowel sounds present. Extremities: No extremity edema. Wounds: Clean and dry. No erythema or signs of infection.  Discharge Condition:Stable and discharged to home  Recent laboratory studies:  Lab Results  Component Value Date   WBC 14.5* 04/18/2015   HGB 14.6 04/18/2015   HCT 43.6 04/18/2015   MCV 86.0 04/18/2015   PLT 286 04/18/2015   Lab  Results  Component Value Date   NA 138 04/18/2015   K 3.6 04/18/2015   CL 99* 04/18/2015   CO2 25 04/18/2015   CREATININE 0.96 04/18/2015   GLUCOSE 173* 04/18/2015   Diagnostic Studies: Dg Chest Port 1 View  EXAM: CHEST 2 VIEW  COMPARISON: 04/18/2015 and prior exams  FINDINGS: A left thoracostomy tube  has been removed. There is no evidence of pneumothorax.  Improved left basilar atelectasis/ opacity noted.  Right basilar subsegmental atelectasis is unchanged.  No other changes noted.  IMPRESSION: Left thoracostomy tube removal - no evidence of pneumothorax.  Improved left basilar atelectasis/ opacity with mild right basilar atelectasis again noted.   Electronically Signed  By: Harmon Pier M.D.  On: 04/19/2015 08:11  Discharge Medications:   Medication List    TAKE these medications        fluticasone 50 MCG/ACT nasal spray  Commonly known as:  FLONASE  Place 1 spray into both nostrils 2 (two) times daily.     oxyCODONE 5 MG immediate release tablet  Commonly known as:  Oxy IR/ROXICODONE  Take 1-2 tablets (5-10 mg total) by mouth every 4 (four) hours as needed for severe pain.        Follow Up Appointments: Follow-up Information    Follow up with Kerin Perna III, MD On 05/06/2015.   Specialty:  Cardiothoracic Surgery   Why:  Pa/LAT CXR to be taken (at [facility] which is in the same building as Dr. Zenaida Niece Trigt's office) on 05/06/2015 at 11:15 am;Appointment time is at 12:00 pm   Contact information:   [address] [employer] [address] [place] [address] [phone]       Follow up with Renae Fickle, MD.   Specialty:  Family Medicine   Why:  Call for a follow up regarding diabetes management and further surveillance of HGA1C 8.2   Contact information:   [address] [place] [address] [phone]       Signed: Doree Fudge MPA-C 04/19/2015, 9:10 AM

## 2015-04-17 NOTE — Op Note (Signed)
Taylor [name], Taylor [name]                 ACCOUNT NO.:  [id number]  MEDICAL RECORD NO.:  [id number]  LOCATION:  3S01C                        FACILITY:  [facility]  PHYSICIAN:  Kerin Perna, M.D.  DATE OF BIRTH:  [birth year]  DATE OF PROCEDURE:  04/16/2015 DATE OF DISCHARGE:                              OPERATIVE REPORT   OPERATIONS:  Left VATS (video-assisted thoracoscopic surgery) with bleb resection of left upper lobe and left lower lobe, mechanical pleurodesis, talc pleurodesis and placement of left chest tube.  SURGEON:  Kerin Perna, M.D.  ASSISTANT:  Rowe Clack, P.A.-C.  ANESTHESIA:  General.  PREOPERATIVE DIAGNOSIS:  Spontaneous left pneumothorax with left lung bleb disease.  POSTOPERATIVE DIAGNOSIS:  Spontaneous left pneumothorax with left lung bleb disease.  INDICATIONS:  The patient is a 45 year old, Caucasian male, reformed smoker, who has had problems with chest discomfort and shortness of breath for about 3 weeks.  Serial x-rays have shown a persistent 15-20% left pneumothorax.  A CT scan of the chest demonstrated a cluster of blebs in the left upper lobe as well as some bleb disease in the lower lobe.  He was felt to be a candidate for surgical resection of the blebs and pleurodesis, because of the persistent pneumothorax, which did not improve with time and the persistent symptoms, which have interfered with his daily activities.  I discussed the procedure of left VATS with stapling of blebs and pleurodesis with the patient including the indications, benefits, alternatives, and risks.  He understood the risks of prolonged air leak, bleeding, wound infection.  He agreed to proceed with surgery.  DESCRIPTION OF PROCEDURE:  The patient was brought to the operating room and placed supine on the operating table where general anesthesia was induced.  A double-lumen endotracheal tube was positioned.  The patient was turned to expose left side up.  The left chest was  prepped and draped as a sterile field.  A proper time-out was performed.  Three small incisions were made around the left chest circumference, centered below the tip of the scapula in the 7th interspace.  The VATS camera was inserted.  The lungs had minimal adhesions.  Bleb disease in the lingula was identified.  Bleb disease in the lower lobe anteriorly was identified.  The rest of the lung appeared to be unremarkable.  Using the Endo-GIA stapling device and VATS instruments, the blebs were isolated and stapled and removed from the lingula with two specimens.  A third wedge resection of the left lower lobe to remove bleb disease was also performed.  The staple lines were inspected and found to be hemostatic.  They were covered with light layer of medical adhesive - Coseal.  Next, a scratch pad used to create mechanical pleurodesis of the left parietal pleura from the midchest level to the apex.  Further talc powder was used for pleurodesis inserted through a red rubber Robinson catheter.  A 28-French chest tube was then placed through a small incision and directed to the apex.  The left lung was re-expanded under direct vision.  The three small VATS incisions were closed in layers using Vicryl and the chest tube was connected to an underwater  seal Pleur-evac drainage canister.  The patient was extubated after he was turned supine and returned to the recovery room in stable condition.     Kerin Perna, M.D.     PV/MEDQ  D:  04/16/2015  T:  04/17/2015  Job:  [id number]

## 2015-04-17 NOTE — Discharge Instructions (Signed)
Thoracoscopy, Care After °Refer to this sheet in the next few weeks. These instructions provide you with information about caring for yourself after your procedure. Your health care provider may also give you more specific instructions. Your treatment has been planned according to current medical practices, but problems sometimes occur. Call your health care provider if you have any problems or questions after your procedure. °WHAT TO EXPECT AFTER THE PROCEDURE: °After your procedure, it is common to feel sore for up to two weeks. °HOME CARE INSTRUCTIONS °· There are many different ways to close and cover an incision, including stitches (sutures), skin glue, and adhesive strips. Follow your health care provider's instructions about: °¨ Incision care. °¨ Bandage (dressing) changes and removal. °¨ Incision closure removal. °· Check your incision area every day for signs of infection. Watch for: °¨ Redness, swelling, or pain. °¨ Fluid, blood, or pus. °· Take medicines only as directed by your health care provider. °· Try to cough often. Coughing helps to protect against lung infection (pneumonia). It may hurt to cough. If this happens, hold a pillow against your chest when you cough. °· Take deep breaths. This also helps to protect against pneumonia. °· If you were given an incentive spirometer, use it as directed by your health care provider. °· Do not take baths, swim, or use a hot tub until your health care provider approves. You may take showers. °· Avoid lifting until your health care provider approves. °· Avoid driving until your health care provider approves. °· Do not travel by airplane after the chest tube is removed until your health care provider approves. °SEEK MEDICAL CARE IF: °· You have a fever. °· Pain medicines do not ease your pain. °· You have redness, swelling, or increasing pain in your incision area. °· You develop a cough that does not go away, or you are coughing up mucus that is yellow or  green. °SEEK IMMEDIATE MEDICAL CARE IF: °· You have fluid, blood, or pus coming from your incision. °· There is a bad smell coming from your incision or dressing. °· You develop a rash. °· You have difficulty breathing. °· You cough up blood. °· You develop light-headedness or you feel faint. °· You develop chest pain. °· Your heartbeat feels irregular or very fast. °  °This information is not intended to replace advice given to you by your health care provider. Make sure you discuss any questions you have with your health care provider. °  °Document Released: 09/10/2004 Document Revised: 03/14/2014 Document Reviewed: 11/06/2013 °Elsevier Interactive Patient Education ©2016 Elsevier Inc. ° °

## 2015-04-17 NOTE — Care Management Note (Signed)
Case Management Note  Patient Details  Name: Taylor [name] MRN: [id number] Date of Birth: 1970/11/06  Subjective/Objective:   Date: 04/17/15 Spoke with patient at the bedside.  Introduced self as Sports coach and explained role in discharge planning and how to be reached.  Verified patient lives in [place] with spouse, pta indep.  Expressed potential need for no DME.  Verified patient anticipates to go home with family, at time of discharge and will have full-time supervision by family at this time to best of their knowledge.  Patient confirmed that he does not have insurance, so would need ast with medications if other than pain meds.   Patient states he is not currently taking any meds at home. Patient drives  And is  Also driven by wife to MD appointments.  Verified patient has PCP Dr. Samuel Germany.  Name, NCM gave patient [facility] information ( which is clinic in [place] that assist people without insurance).   Plan: CM will continue to follow for discharge planning and [facility] resources.                  Action/Plan: S/P VATS BLEB Resection: Talc Pleuradesis- no air leak, on water seal, dc aline , Patient has DM per MD patient  Does not want to go on oral agents for DM now and wants to follow up with his pcp Dr. Samuel Germany.  Expected Discharge Date:                  Expected Discharge Plan:  Home/Self Care  In-House Referral:     Discharge planning Services  CM Consult  Post Acute Care Choice:    Choice offered to:     DME Arranged:    DME Agency:     HH Arranged:    HH Agency:     Status of Service:  In process, will continue to follow  Medicare Important Message Given:    Date Medicare IM Given:    Medicare IM give by:    Date Additional Medicare IM Given:    Additional Medicare Important Message give by:     If discussed at Long Length of Stay Meetings, dates discussed:    Additional Comments:  Leone Haven, RN 04/17/2015, 3:29 PM

## 2015-04-17 NOTE — Progress Notes (Addendum)
[address]       [name] [address]             [phone]      1 Day Post-Op Procedure(s) (LRB): VIDEO ASSISTED THORACOSCOPY (Left) BLEB RESECTION times two from upper lobe and one from lower lobe (Left) TALC PLEURADESIS and mechanical pleuradesis. (Left) Subjective: Feels pretty well, some pain, breathing is pretty comfortable  Objective: Vital signs in last 24 hours: Temp:  [97.7 F (36.5 C)-98.5 F (36.9 C)] 97.7 F (36.5 C) (02/10 0344) Pulse Rate:  [55-93] 55 (02/10 0344) Cardiac Rhythm:  [-] Normal sinus rhythm (02/09 2045) Resp:  [8-21] 16 (02/10 0400) BP: (100-175)/(50-96) 100/54 mmHg (02/10 0344) SpO2:  [91 %-100 %] 97 % (02/10 0400) Arterial Line BP: (128-166)/(59-92) 135/64 mmHg (02/09 2000) Weight:  [214 lb 11.7 oz (97.4 kg)] 214 lb 11.7 oz (97.4 kg) (02/09 1343)  Hemodynamic parameters for last 24 hours:    Intake/Output from previous day: 02/09 0701 - 02/10 0700 In: 1003.3 [P.O.:480; I.V.:473.3; IV Piggyback:50] Out: 1678 [Urine:1500; Chest Tube:178] Intake/Output this shift:    General appearance: alert, cooperative and no distress Heart: regular rate and rhythm Lungs: clear to auscultation bilaterally Abdomen: benign Extremities: no edema Wound: incis healing well  Lab Results:  Recent Labs  04/15/15 0847 04/17/15 0417  WBC 7.8 13.8*  HGB 15.5 13.0  HCT 45.8 39.6  PLT 278 267   BMET:  Recent Labs  04/15/15 0847 04/17/15 0417  NA 136 135  K 4.3 3.7  CL 106 102  CO2 20* 25  GLUCOSE 231* 197*  BUN 14 13  CREATININE 0.92 1.00  CALCIUM 8.9 8.2*    PT/INR:  Recent Labs  04/15/15 0847  LABPROT 13.8  INR 1.04   ABG    Component Value Date/Time   PHART 7.387 04/17/2015 0445   HCO3 23.4 04/17/2015 0445   TCO2 24.7 04/17/2015 0445   ACIDBASEDEF 0.9 04/17/2015 0445   O2SAT 96.2 04/17/2015 0445   CBG (last 3)   Recent Labs  04/15/15 0828 04/16/15 0709 04/16/15 1038  GLUCAP 224* 178* 189*     Meds Scheduled Meds: . acetaminophen  1,000 mg Oral 4 times per day   Or  . acetaminophen (TYLENOL) oral liquid 160 mg/5 mL  1,000 mg Oral 4 times per day  . albuterol  2.5 mg Nebulization Q4H WA  . bisacodyl  10 mg Oral Daily  . cefUROXime (ZINACEF)  IV  1.5 g Intravenous Q12H  . fentaNYL   Intravenous 6 times per day  . furosemide  20 mg Intravenous Once  . Influenza vac split quadrivalent PF  0.5 mL Intramuscular Tomorrow-1000  . insulin aspart  0-15 Units Subcutaneous TID WC  . insulin aspart  0-5 Units Subcutaneous QHS  . insulin aspart  4 Units Subcutaneous TID WC  . ketorolac  30 mg Intravenous 4 times per day  . metoCLOPramide (REGLAN) injection  10 mg Intravenous 4 times per day  . pneumococcal 23 valent vaccine  0.5 mL Intramuscular Tomorrow-1000  . senna-docusate  1 tablet Oral QHS   Continuous Infusions: . dextrose 5 % and 0.9% NaCl 10 mL/hr at 04/17/15 0800   PRN Meds:.diphenhydrAMINE **OR** diphenhydrAMINE, naloxone **AND** sodium chloride flush, ondansetron (ZOFRAN) IV, oxyCODONE, potassium chloride, traMADol  Xrays Dg Chest 2 View  04/15/2015  CLINICAL DATA:  Pneumothorax.  Bronchoscopy. EXAM: CHEST  2 VIEW COMPARISON:  04/10/2015. FINDINGS: Mediastinum hilar structures normal. Stable left-sided small pneumothorax. No focal  infiltrate. No pleural effusion or pneumothorax. Heart size normal. No acute bony abnormality.a IMPRESSION: Stable known left pneumothorax.  No change from prior exam. Electronically Signed   By: Maisie Fus  Register   On: 04/15/2015 09:22   Dg Chest Port 1 View  04/17/2015  CLINICAL DATA:  Lung blebs, history of pneumothorax on the left EXAM: PORTABLE CHEST 1 VIEW COMPARISON:  Portable chest x-ray of April 16, 2015 FINDINGS: No pneumothorax is evident on the left. The chest tube is unchanged in position with the tip projecting between the posterior aspects of the fourth and fifth ribs. There is subsegmental atelectasis at the left lung base. There  is no significant pleural effusion. There is minimal atelectasis at the right lung base. The pulmonary interstitial markings elsewhere in the right lung have improved. The left heart border remains obscured. The pulmonary vascularity has normalized. IMPRESSION: Improving interstitial edema bilaterally. Persistent subsegmental atelectasis bilaterally. There is no recurrent pneumothorax. The chest tube is in reasonable position. Electronically Signed   By: David  [place] M.D.   On: 04/17/2015 07:34   Dg Chest Port 1 View  04/16/2015  CLINICAL DATA:  S/p VATS HX PTX BLEBS EXAM: PORTABLE CHEST 1 VIEW COMPARISON:  04/15/2015 FINDINGS: Left-sided chest tube is in place. No evidence for pneumothorax. There is left-sided pleural thickening or small pleural effusion. The heart is enlarged. There are bilateral changes of mild pulmonary edema. Left basilar atelectasis or early consolidation noted. IMPRESSION: 1. Interval placement of left-sided chest tube.  No pneumothorax. 2. Cardiomegaly and mild edema. 3. Left lower lobe atelectasis or early infiltrate. Electronically Signed   By: Norva Pavlov M.D.   On: 04/16/2015 13:02    Assessment/Plan: S/P Procedure(s) (LRB): VIDEO ASSISTED THORACOSCOPY (Left) BLEB RESECTION times two from upper lobe and one from lower lobe (Left) TALC PLEURADESIS and mechanical pleuradesis. (Left)  1 doing well, no air leak, CXR shows lung expanded- place on H2O seal 2 d/c aline and foley 3 decrease IV fluids 4  SSI- DM is poorly controlled with diet-may need oral agents. Patient wants to follow-up with his PMD Dr Samuel Germany as outpatient and not go on meds now. Discussed the long term implications of poor DM control and he understands     LOS: 1 day    [name],[name] 04/17/2015  patient examined and medical record reviewed,agree with above note. Kathlee Nations Trigt III 04/17/2015

## 2015-04-18 ENCOUNTER — Inpatient Hospital Stay (HOSPITAL_COMMUNITY): Payer: Self-pay

## 2015-04-18 LAB — GLUCOSE, CAPILLARY
Glucose-Capillary: 197 mg/dL — ABNORMAL HIGH (ref 65–99)
Glucose-Capillary: 149 mg/dL — ABNORMAL HIGH (ref 65–99)
Glucose-Capillary: 122 mg/dL — ABNORMAL HIGH (ref 65–99)
Glucose-Capillary: 176 mg/dL — ABNORMAL HIGH (ref 65–99)

## 2015-04-18 LAB — COMPREHENSIVE METABOLIC PANEL
ALT: 26 U/L (ref 17–63)
AST: 24 U/L (ref 15–41)
Albumin: 3.5 g/dL (ref 3.5–5.0)
Alkaline Phosphatase: 87 U/L (ref 38–126)
Anion gap: 14 (ref 5–15)
BUN: 12 mg/dL (ref 6–20)
CO2: 25 mmol/L (ref 22–32)
Calcium: 9.1 mg/dL (ref 8.9–10.3)
Chloride: 99 mmol/L — ABNORMAL LOW (ref 101–111)
Creatinine, Ser: 0.96 mg/dL (ref 0.61–1.24)
GFR calc Af Amer: >60 mL/min (ref >60)
GFR calc non Af Amer: >60 mL/min (ref >60)
Glucose, Bld: 173 mg/dL — ABNORMAL HIGH (ref 65–99)
Potassium: 3.6 mmol/L (ref 3.5–5.1)
Sodium: 138 mmol/L (ref 135–145)
Total Bilirubin: 1 mg/dL (ref 0.3–1.2)
Total Protein: 7.5 g/dL (ref 6.5–8.1)

## 2015-04-18 LAB — CBC
HCT: 43.6 % (ref 39.0–52.0)
Hemoglobin: 14.6 g/dL (ref 13.0–17.0)
MCH: 28.8 pg (ref 26.0–34.0)
MCHC: 33.5 g/dL (ref 30.0–36.0)
MCV: 86 fL (ref 78.0–100.0)
Platelets: 286 10*3/uL (ref 150–400)
RBC: 5.07 MIL/uL (ref 4.22–5.81)
RDW: 12.9 % (ref 11.5–15.5)
WBC: 14.5 10*3/uL — ABNORMAL HIGH (ref 4.0–10.5)

## 2015-04-18 MED ORDER — POTASSIUM CHLORIDE CRYS ER 10 MEQ PO TBCR
30.0000 meq | EXTENDED_RELEASE_TABLET | Freq: Once | ORAL | Status: AC
  Administered 2015-04-18: 30 meq via ORAL
  Filled 2015-04-18: qty 1

## 2015-04-18 MED ORDER — ALPRAZOLAM 0.5 MG PO TABS
0.5000 mg | ORAL_TABLET | Freq: Two times a day (BID) | ORAL | Status: DC | PRN
  Administered 2015-04-18 – 2015-04-19 (×2): 0.5 mg via ORAL
  Filled 2015-04-18 (×2): qty 1

## 2015-04-18 NOTE — Progress Notes (Addendum)
      [address]       [name] [address]             [phone]       2 Days Post-Op Procedure(s) (LRB): VIDEO ASSISTED THORACOSCOPY (Left) BLEB RESECTION times two from upper lobe and one from lower lobe (Left) TALC PLEURADESIS and mechanical pleuradesis. (Left)  Subjective: Patient with anxiety  Objective: Vital signs in last 24 hours: Temp:  [97.2 F (36.2 C)-99.6 F (37.6 C)] 98.5 F (36.9 C) (02/11 0747) Pulse Rate:  [69-93] 70 (02/11 0747) Cardiac Rhythm:  [-] Normal sinus rhythm (02/11 0844) Resp:  [16-24] 19 (02/11 0839) BP: (116-153)/(63-95) 153/95 mmHg (02/11 0747) SpO2:  [92 %-98 %] 96 % (02/11 0839)      Intake/Output from previous day: 02/10 0701 - 02/11 0700 In: 480 [P.O.:480] Out: 1180 [Urine:1060; Chest Tube:120]   Physical Exam:  Cardiovascular: RRR Pulmonary: Coarse on right and clear on left. Abdomen: Soft, non tender, bowel sounds present. Extremities: No  extremity edema. Wounds: Clean and dry.  No erythema or signs of infection. Chest Tube: to water seal, tidling but no air leak  Lab Results: CBC: Recent Labs  04/17/15 0417 04/18/15 0553  WBC 13.8* 14.5*  HGB 13.0 14.6  HCT 39.6 43.6  PLT 267 286   BMET:  Recent Labs  04/17/15 0417 04/18/15 0553  NA 135 138  K 3.7 3.6  CL 102 99*  CO2 25 25  GLUCOSE 197* 173*  BUN 13 12  CREATININE 1.00 0.96  CALCIUM 8.2* 9.1    PT/INR: No results for input(s): LABPROT, INR in the last 72 hours. ABG:  INR: Will add last result for INR, ABG once components are confirmed Will add last 4 CBG results once components are confirmed  Assessment/Plan:  1. CV - SR in the 70's. 2.  Pulmonary - Chest tube with 120 cc of output last 24 hours. There is tidling but no true air leak. CXR this am shows no pneumothorax, small left pleural effusion, and bibasilar atelectasis. Remove chest tube today..Encourage incentive spirometer. 3. Supplement potassium 4. DM-CBGs  176/133/197. Pre op HGA1C 8.2. Will need close medical follow up after discharge. 5. Xanax PRN anxiety 6. Possible home in am  ZIMMERMAN,DONIELLE MPA-C 04/18/2015,9:13 AM Patient seen and examined, agree with above CT out. No complaints  Salvatore Decent. Dorris Fetch, MD [facility] [phone]

## 2015-04-19 ENCOUNTER — Inpatient Hospital Stay (HOSPITAL_COMMUNITY): Payer: Self-pay

## 2015-04-19 MED ORDER — LIVING WELL WITH DIABETES BOOK
Freq: Once | Status: AC
  Administered 2015-04-19: 06:00:00
  Filled 2015-04-19: qty 1

## 2015-04-19 MED ORDER — OXYCODONE HCL 5 MG PO TABS: 5.0000 mg | ORAL_TABLET | ORAL | Status: AC | PRN

## 2015-04-19 NOTE — Progress Notes (Signed)
D/C'd fentanyl PCA. Wasted 14 mL fentanyl in sink. Witnessed by [employer].

## 2015-04-19 NOTE — Progress Notes (Signed)
Discharge instructions given. No questions or concerns at this time. Pt d/c'd via family vehicle.  

## 2015-04-19 NOTE — Progress Notes (Addendum)
      [address]       [place] [address]             [phone]       3 Days Post-Op Procedure(s) (LRB): VIDEO ASSISTED THORACOSCOPY (Left) BLEB RESECTION times two from upper lobe and one from lower lobe (Left) TALC PLEURADESIS and mechanical pleuradesis. (Left)  Subjective: Patient looking forward to going home  Objective: Vital signs in last 24 hours: Temp:  [97.9 F (36.6 C)-99.2 F (37.3 C)] 98.2 F (36.8 C) (02/12 0719) Pulse Rate:  [64-92] 92 (02/12 0015) Cardiac Rhythm:  [-] Sinus bradycardia (02/12 0700) Resp:  [16-22] 16 (02/12 0400) BP: (122-151)/(76-81) 151/81 mmHg (02/12 0015) SpO2:  [96 %-100 %] 98 % (02/12 0400)      Intake/Output from previous day: 02/11 0701 - 02/12 0700 In: 240 [P.O.:240] Out: 450 [Urine:450]   Physical Exam:  Cardiovascular: RRR Pulmonary: Coarse on right and clear on left. Abdomen: Soft, non tender, bowel sounds present. Extremities: No  extremity edema. Wounds: Clean and dry.  No erythema or signs of infection.   Lab Results: CBC:  Recent Labs  04/17/15 0417 04/18/15 0553  WBC 13.8* 14.5*  HGB 13.0 14.6  HCT 39.6 43.6  PLT 267 286   BMET:   Recent Labs  04/17/15 0417 04/18/15 0553  NA 135 138  K 3.7 3.6  CL 102 99*  CO2 25 25  GLUCOSE 197* 173*  BUN 13 12  CREATININE 1.00 0.96  CALCIUM 8.2* 9.1    PT/INR: No results for input(s): LABPROT, INR in the last 72 hours. ABG:  INR: Will add last result for INR, ABG once components are confirmed Will add last 4 CBG results once components are confirmed  Assessment/Plan:  1. CV - SR in the 70's. 2.  Pulmonary - CXR this am shows no pneumothorax, improving left base atelectasis .Encourage incentive spirometer. 3. Supplement potassium 4. DM-CBGs 176/133/197. Pre op HGA1C 8.2. Will need close medical follow up after discharge. Had long discussion and patient agreeable to follow up with medical doctor after discharge 5.  Discharge  ZIMMERMAN,DONIELLE MPA-C 04/19/2015,8:59 AM  Patient seen and examined, agree with above  Viviann Spare C. Dorris Fetch, MD [facility] [phone]

## 2015-04-22 ENCOUNTER — Other Ambulatory Visit: Payer: Self-pay | Admitting: *Deleted

## 2015-04-22 DIAGNOSIS — G8918 Other acute postprocedural pain: Secondary | ICD-10-CM

## 2015-04-22 LAB — GLUCOSE, CAPILLARY
Glucose-Capillary: 169 mg/dL — ABNORMAL HIGH (ref 65–99)
Glucose-Capillary: 155 mg/dL — ABNORMAL HIGH (ref 65–99)

## 2015-04-22 MED ORDER — TRAMADOL HCL 50 MG PO TABS: 50.0000 mg | ORAL_TABLET | Freq: Four times a day (QID) | ORAL | Status: AC | PRN

## 2015-04-22 MED ORDER — TRAMADOL HCL 50 MG PO TABS: 50.0000 mg | ORAL_TABLET | Freq: Four times a day (QID) | ORAL | Status: DC | PRN

## 2015-04-22 NOTE — Telephone Encounter (Signed)
Mr. Rzepka has requested a refill for Oxycodone after thoracic surgery, but he says he lives an hour away.  I suggested he try Ultram.  He said he was willing to do that.  I will fax a script to his pharmacy.  If after trying it he finds it is ineffective in pain relief, he knows he can request the Oxycodone.

## 2015-05-05 ENCOUNTER — Other Ambulatory Visit: Payer: Self-pay | Admitting: Cardiothoracic Surgery

## 2015-05-05 DIAGNOSIS — J439 Emphysema, unspecified: Secondary | ICD-10-CM

## 2015-05-06 ENCOUNTER — Encounter: Payer: Self-pay | Admitting: Cardiothoracic Surgery

## 2015-05-06 ENCOUNTER — Ambulatory Visit
Admission: RE | Admit: 2015-05-06 | Discharge: 2015-05-06 | Disposition: A | Discharge status: Home / Self Care | Payer: No Typology Code available for payment source | Source: Ambulatory Visit | Attending: Cardiothoracic Surgery | Admitting: Cardiothoracic Surgery

## 2015-05-06 ENCOUNTER — Ambulatory Visit (INDEPENDENT_AMBULATORY_CARE_PROVIDER_SITE_OTHER): Payer: Self-pay | Admitting: Cardiothoracic Surgery

## 2015-05-06 VITALS — BP 129/83 | HR 65 | Resp 16 | Ht 70.0 in | Wt 196.0 lb

## 2015-05-06 DIAGNOSIS — J939 Pneumothorax, unspecified: Secondary | ICD-10-CM

## 2015-05-06 DIAGNOSIS — Z09 Encounter for follow-up examination after completed treatment for conditions other than malignant neoplasm: Secondary | ICD-10-CM

## 2015-05-06 DIAGNOSIS — J439 Emphysema, unspecified: Secondary | ICD-10-CM

## 2015-05-06 NOTE — Progress Notes (Signed)
PCP is Renae Fickle, MD Referring Provider is Marcellus Scott, MD  Chief Complaint  Patient presents with  . Routine Post Op    s/p 04/16/15 with a CXR    [id number] postop office visit 3 weeks after left VATS for resection of blebs and chronic left pneumothorax. Pathology report indicates benign bleb disease. The patient is a nonsmoker. He is taking only tramadol for pain. He denies shortness of breath. Chest x-ray today shows no pneumothorax with some pleural thickening at the left base   Past Medical History  Diagnosis Date  . Diabetes mellitus without complication (HCC)     diet controlled  . Pneumothorax   . GERD (gastroesophageal reflux disease)   . Seasonal allergies     Past Surgical History  Procedure Laterality Date  . Cyst excision Right approx 20 years ago    right upper arm, at [facility]  . Video assisted thoracoscopy Left 04/16/2015    Procedure: VIDEO ASSISTED THORACOSCOPY;  Surgeon: Kerin Perna, MD;  Location: [facility] OR;  Service: Thoracic;  Laterality: Left;  . Stapling of blebs Left 04/16/2015    Procedure: BLEB RESECTION times two from upper lobe and one from lower lobe;  Surgeon: Kerin Perna, MD;  Location: [facility] OR;  Service: Thoracic;  Laterality: Left;  . Talc pleurodesis Left 04/16/2015    Procedure: TALC PLEURADESIS and mechanical pleuradesis.;  Surgeon: Kerin Perna, MD;  Location: [facility] OR;  Service: Thoracic;  Laterality: Left;    Family History  Problem Relation Age of Onset  . Cancer Mother     kidney  . Diabetes Father   . Hypertension Father     Social History Social History  Substance Use Topics  . Smoking status: Former Smoker -- 1.00 packs/day for 20 years    Types: Cigarettes    Quit date: 04/09/2005  . Smokeless tobacco: None  . Alcohol Use: No    Current Outpatient Prescriptions  Medication Sig Dispense Refill  . fluticasone (FLONASE) 50 MCG/ACT nasal spray Place 1 spray into both nostrils 2 (two) times daily.    Marland Kitchen  oxyCODONE (OXY IR/ROXICODONE) 5 MG immediate release tablet Take 1-2 tablets (5-10 mg total) by mouth every 4 (four) hours as needed for severe pain. 40 tablet 0  . traMADol (ULTRAM) 50 MG tablet Take 1-2 tablets (50-100 mg total) by mouth every 6 (six) hours as needed. 40 tablet 0   No current facility-administered medications for this visit.    Allergies  Allergen Reactions  . Shellfish Allergy Other (See Comments)    Eyes get itchy, lips tingle  . Prednisone Rash    Review of Systems  Improved appetite and strength Still having some neuritic-type pain under the left breast line anteriorly No drainage or infection of the incisions No fever or cough  BP 129/83 mmHg  Pulse 65  Resp 16  Ht  (1.778 m)  Wt 196 lb (88.905 kg)  BMI 28.12 kg/m2  SpO2 98% Physical Exam Alert and comfortable Lungs clear Heart rate regular Left VATS incision is well-healed Neuro intact  Diagnostic Tests: Chest x-ray without L pneumothorax, resolving postop changes and improving lung volume on left  Impression: Doing well now 3 weeks after surgery We discussed his activity limitations -- he cannot drive and a normal activities He should not perform heavy work or lifting until after April 1.   Plan:return for follow followup to monitor progress in 4 weeks if necessary  -the patient will call.   Theron Arista  Prescott Gum III, MD [facility] [phone]

## 2015-06-17 ENCOUNTER — Encounter: Payer: Self-pay | Admitting: Cardiothoracic Surgery

## 2016-04-17 DIAGNOSIS — J0100 Acute maxillary sinusitis, unspecified: Secondary | ICD-10-CM | POA: Diagnosis not present

## 2016-05-20 DIAGNOSIS — Z0000 Encounter for general adult medical examination without abnormal findings: Secondary | ICD-10-CM | POA: Diagnosis not present

## 2016-05-20 DIAGNOSIS — Z6829 Body mass index (BMI) 29.0-29.9, adult: Secondary | ICD-10-CM | POA: Diagnosis not present

## 2016-05-20 DIAGNOSIS — J019 Acute sinusitis, unspecified: Secondary | ICD-10-CM | POA: Diagnosis not present

## 2016-07-09 DIAGNOSIS — J0100 Acute maxillary sinusitis, unspecified: Secondary | ICD-10-CM | POA: Diagnosis not present

## 2016-07-13 DIAGNOSIS — E663 Overweight: Secondary | ICD-10-CM | POA: Diagnosis not present

## 2016-07-13 DIAGNOSIS — J9801 Acute bronchospasm: Secondary | ICD-10-CM | POA: Diagnosis not present

## 2016-07-13 DIAGNOSIS — R6889 Other general symptoms and signs: Secondary | ICD-10-CM | POA: Diagnosis not present

## 2016-07-13 DIAGNOSIS — J189 Pneumonia, unspecified organism: Secondary | ICD-10-CM | POA: Diagnosis not present

## 2016-09-06 DIAGNOSIS — R079 Chest pain, unspecified: Secondary | ICD-10-CM | POA: Diagnosis not present

## 2016-09-06 DIAGNOSIS — J189 Pneumonia, unspecified organism: Secondary | ICD-10-CM | POA: Diagnosis not present

## 2016-12-22 DIAGNOSIS — S0500XA Injury of conjunctiva and corneal abrasion without foreign body, unspecified eye, initial encounter: Secondary | ICD-10-CM | POA: Diagnosis not present

## 2016-12-26 DIAGNOSIS — S0521XA Ocular laceration and rupture with prolapse or loss of intraocular tissue, right eye, initial encounter: Secondary | ICD-10-CM | POA: Diagnosis not present

## 2016-12-29 DIAGNOSIS — K591 Functional diarrhea: Secondary | ICD-10-CM | POA: Diagnosis not present

## 2016-12-30 DIAGNOSIS — R197 Diarrhea, unspecified: Secondary | ICD-10-CM | POA: Diagnosis not present

## 2017-01-24 DIAGNOSIS — H524 Presbyopia: Secondary | ICD-10-CM | POA: Diagnosis not present

## 2017-02-20 DIAGNOSIS — R079 Chest pain, unspecified: Secondary | ICD-10-CM | POA: Diagnosis not present

## 2017-02-20 DIAGNOSIS — E119 Type 2 diabetes mellitus without complications: Secondary | ICD-10-CM | POA: Diagnosis not present

## 2017-02-20 DIAGNOSIS — E785 Hyperlipidemia, unspecified: Secondary | ICD-10-CM | POA: Diagnosis not present

## 2017-02-20 DIAGNOSIS — F419 Anxiety disorder, unspecified: Secondary | ICD-10-CM | POA: Diagnosis not present

## 2017-02-20 DIAGNOSIS — J95811 Postprocedural pneumothorax: Secondary | ICD-10-CM | POA: Diagnosis not present

## 2017-02-20 DIAGNOSIS — Y838 Other surgical procedures as the cause of abnormal reaction of the patient, or of later complication, without mention of misadventure at the time of the procedure: Secondary | ICD-10-CM | POA: Diagnosis not present

## 2017-03-18 DIAGNOSIS — J309 Allergic rhinitis, unspecified: Secondary | ICD-10-CM | POA: Diagnosis not present

## 2017-03-18 DIAGNOSIS — J0110 Acute frontal sinusitis, unspecified: Secondary | ICD-10-CM | POA: Diagnosis not present

## 2017-04-05 DIAGNOSIS — E119 Type 2 diabetes mellitus without complications: Secondary | ICD-10-CM | POA: Diagnosis not present

## 2017-04-05 DIAGNOSIS — R1011 Right upper quadrant pain: Secondary | ICD-10-CM | POA: Diagnosis not present

## 2017-04-05 DIAGNOSIS — F419 Anxiety disorder, unspecified: Secondary | ICD-10-CM | POA: Diagnosis not present

## 2017-04-05 DIAGNOSIS — E785 Hyperlipidemia, unspecified: Secondary | ICD-10-CM | POA: Diagnosis not present

## 2017-04-07 DIAGNOSIS — R1011 Right upper quadrant pain: Secondary | ICD-10-CM | POA: Diagnosis not present

## 2017-04-07 DIAGNOSIS — K7689 Other specified diseases of liver: Secondary | ICD-10-CM | POA: Diagnosis not present

## 2017-04-07 DIAGNOSIS — K760 Fatty (change of) liver, not elsewhere classified: Secondary | ICD-10-CM | POA: Diagnosis not present

## 2017-05-02 DIAGNOSIS — J069 Acute upper respiratory infection, unspecified: Secondary | ICD-10-CM | POA: Diagnosis not present

## 2017-05-03 DIAGNOSIS — J301 Allergic rhinitis due to pollen: Secondary | ICD-10-CM | POA: Diagnosis not present

## 2017-05-03 DIAGNOSIS — J019 Acute sinusitis, unspecified: Secondary | ICD-10-CM | POA: Diagnosis not present

## 2017-05-03 DIAGNOSIS — Z6828 Body mass index (BMI) 28.0-28.9, adult: Secondary | ICD-10-CM | POA: Diagnosis not present

## 2017-05-22 DIAGNOSIS — J301 Allergic rhinitis due to pollen: Secondary | ICD-10-CM | POA: Diagnosis not present

## 2017-05-22 DIAGNOSIS — E119 Type 2 diabetes mellitus without complications: Secondary | ICD-10-CM | POA: Diagnosis not present

## 2017-05-22 DIAGNOSIS — F419 Anxiety disorder, unspecified: Secondary | ICD-10-CM | POA: Diagnosis not present

## 2017-05-22 DIAGNOSIS — E785 Hyperlipidemia, unspecified: Secondary | ICD-10-CM | POA: Diagnosis not present

## 2017-08-22 DIAGNOSIS — F419 Anxiety disorder, unspecified: Secondary | ICD-10-CM | POA: Diagnosis not present

## 2017-08-22 DIAGNOSIS — E785 Hyperlipidemia, unspecified: Secondary | ICD-10-CM | POA: Diagnosis not present

## 2017-08-22 DIAGNOSIS — R221 Localized swelling, mass and lump, neck: Secondary | ICD-10-CM | POA: Diagnosis not present

## 2017-08-22 DIAGNOSIS — Z0000 Encounter for general adult medical examination without abnormal findings: Secondary | ICD-10-CM | POA: Diagnosis not present

## 2017-09-12 DIAGNOSIS — J301 Allergic rhinitis due to pollen: Secondary | ICD-10-CM | POA: Diagnosis not present

## 2017-09-12 DIAGNOSIS — Z6828 Body mass index (BMI) 28.0-28.9, adult: Secondary | ICD-10-CM | POA: Diagnosis not present

## 2017-09-12 DIAGNOSIS — F419 Anxiety disorder, unspecified: Secondary | ICD-10-CM | POA: Diagnosis not present

## 2017-09-20 DIAGNOSIS — R221 Localized swelling, mass and lump, neck: Secondary | ICD-10-CM | POA: Diagnosis not present

## 2017-09-20 DIAGNOSIS — Z87891 Personal history of nicotine dependence: Secondary | ICD-10-CM | POA: Diagnosis not present

## 2017-09-20 DIAGNOSIS — J342 Deviated nasal septum: Secondary | ICD-10-CM | POA: Diagnosis not present

## 2017-10-14 DIAGNOSIS — J0100 Acute maxillary sinusitis, unspecified: Secondary | ICD-10-CM | POA: Diagnosis not present

## 2017-12-28 DIAGNOSIS — Z6829 Body mass index (BMI) 29.0-29.9, adult: Secondary | ICD-10-CM | POA: Diagnosis not present

## 2017-12-28 DIAGNOSIS — E785 Hyperlipidemia, unspecified: Secondary | ICD-10-CM | POA: Diagnosis not present

## 2017-12-28 DIAGNOSIS — E118 Type 2 diabetes mellitus with unspecified complications: Secondary | ICD-10-CM | POA: Diagnosis not present

## 2017-12-29 IMAGING — CR DG CHEST 2V
2 series · 2 of 2 positions shown · non-contrast
Comparison: None in PACs

CLINICAL DATA: Spontaneous pneumothorax on the left, mid chest pain
and shortness of breath

EXAM:
CHEST  2 VIEW

[w chest pa]
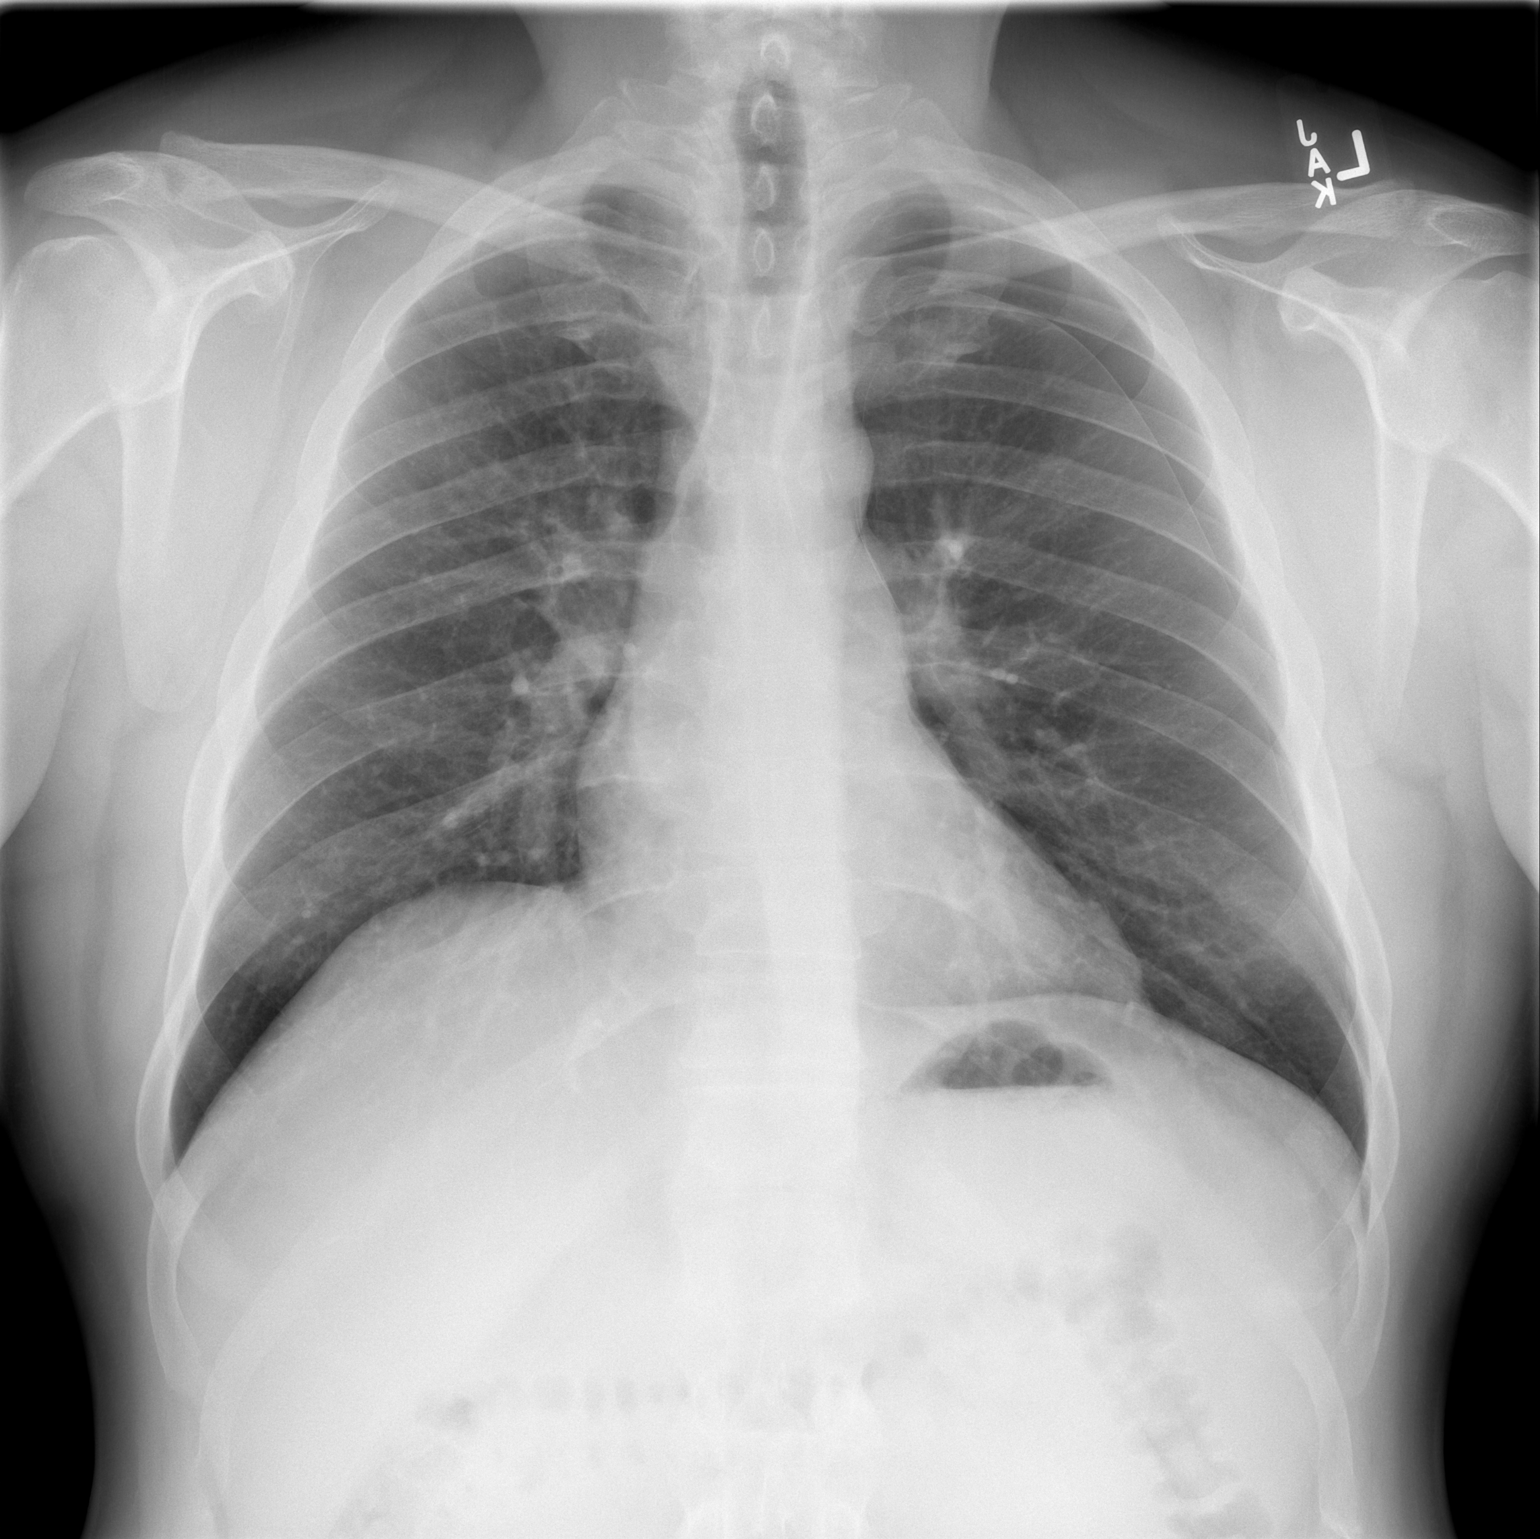

[w chest lat]
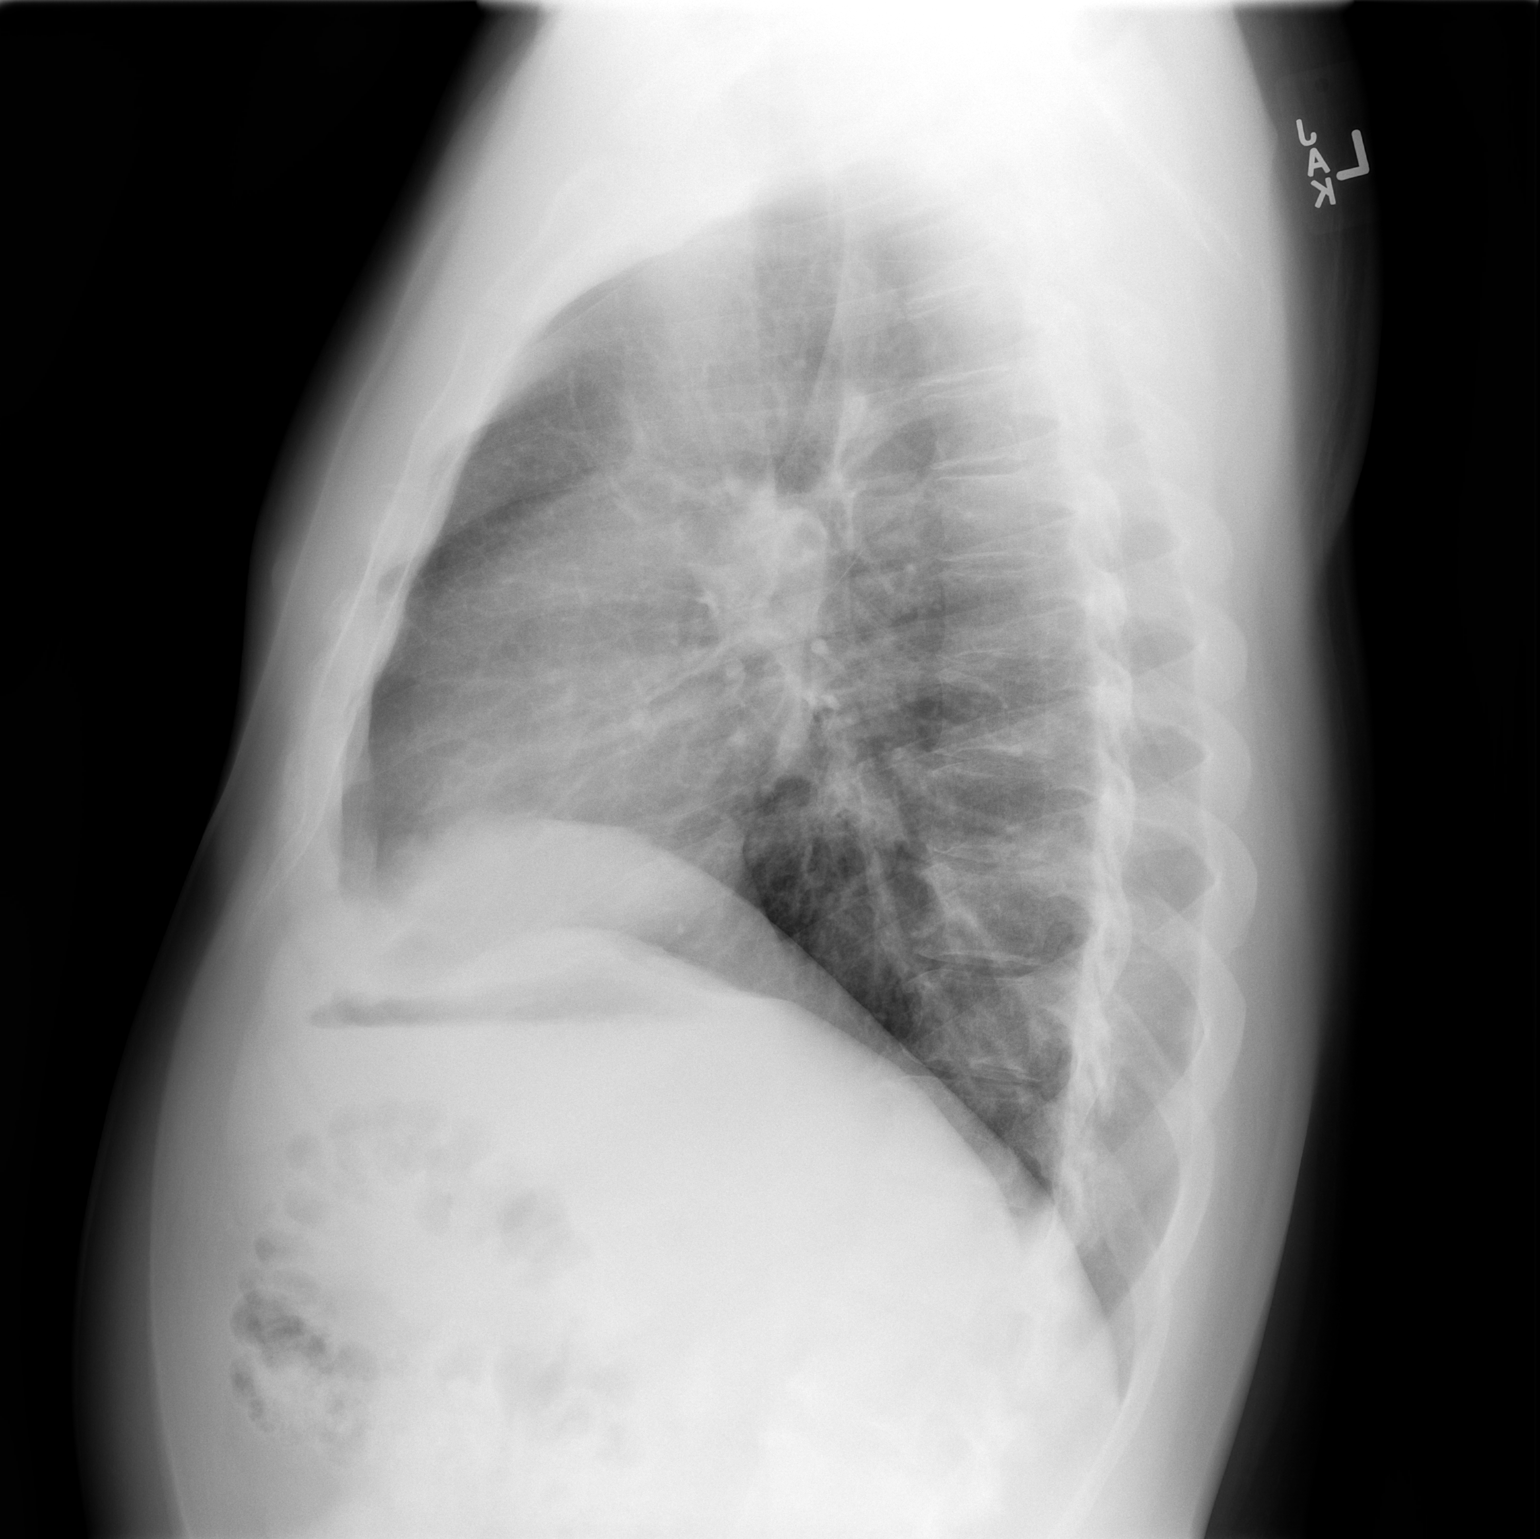

[2 of 2 positions shown; findings below may reference images not displayed]

FINDINGS: There is a pneumothorax on the left amounting to approximately
15-20% of the lung volume. There is no pleural effusion. There is no
mediastinal shift. The remainder of the left lung and the right lung
are clear. The heart and pulmonary vascularity are normal. The
mediastinum is normal in width. The bony thorax exhibits no acute
abnormality.
IMPRESSION: There is an approximately 15- 20%left pneumothorax consistent with
the clinical history. The examination is otherwise within the limits
of normal.

Critical Value/emergent results were called by telephone at the time
of interpretation on 04/10/2015 at [DATE] to Dr. REISHIRO SAMUKA ,
who verbally acknowledged these results.

## 2018-01-04 IMAGING — CR DG CHEST 1V PORT
1 series · 1 of 1 positions shown · non-contrast
Comparison: 04/15/2015

CLINICAL DATA: S/p VATS HX PTX BLEBS

EXAM:
PORTABLE CHEST 1 VIEW

[AP]
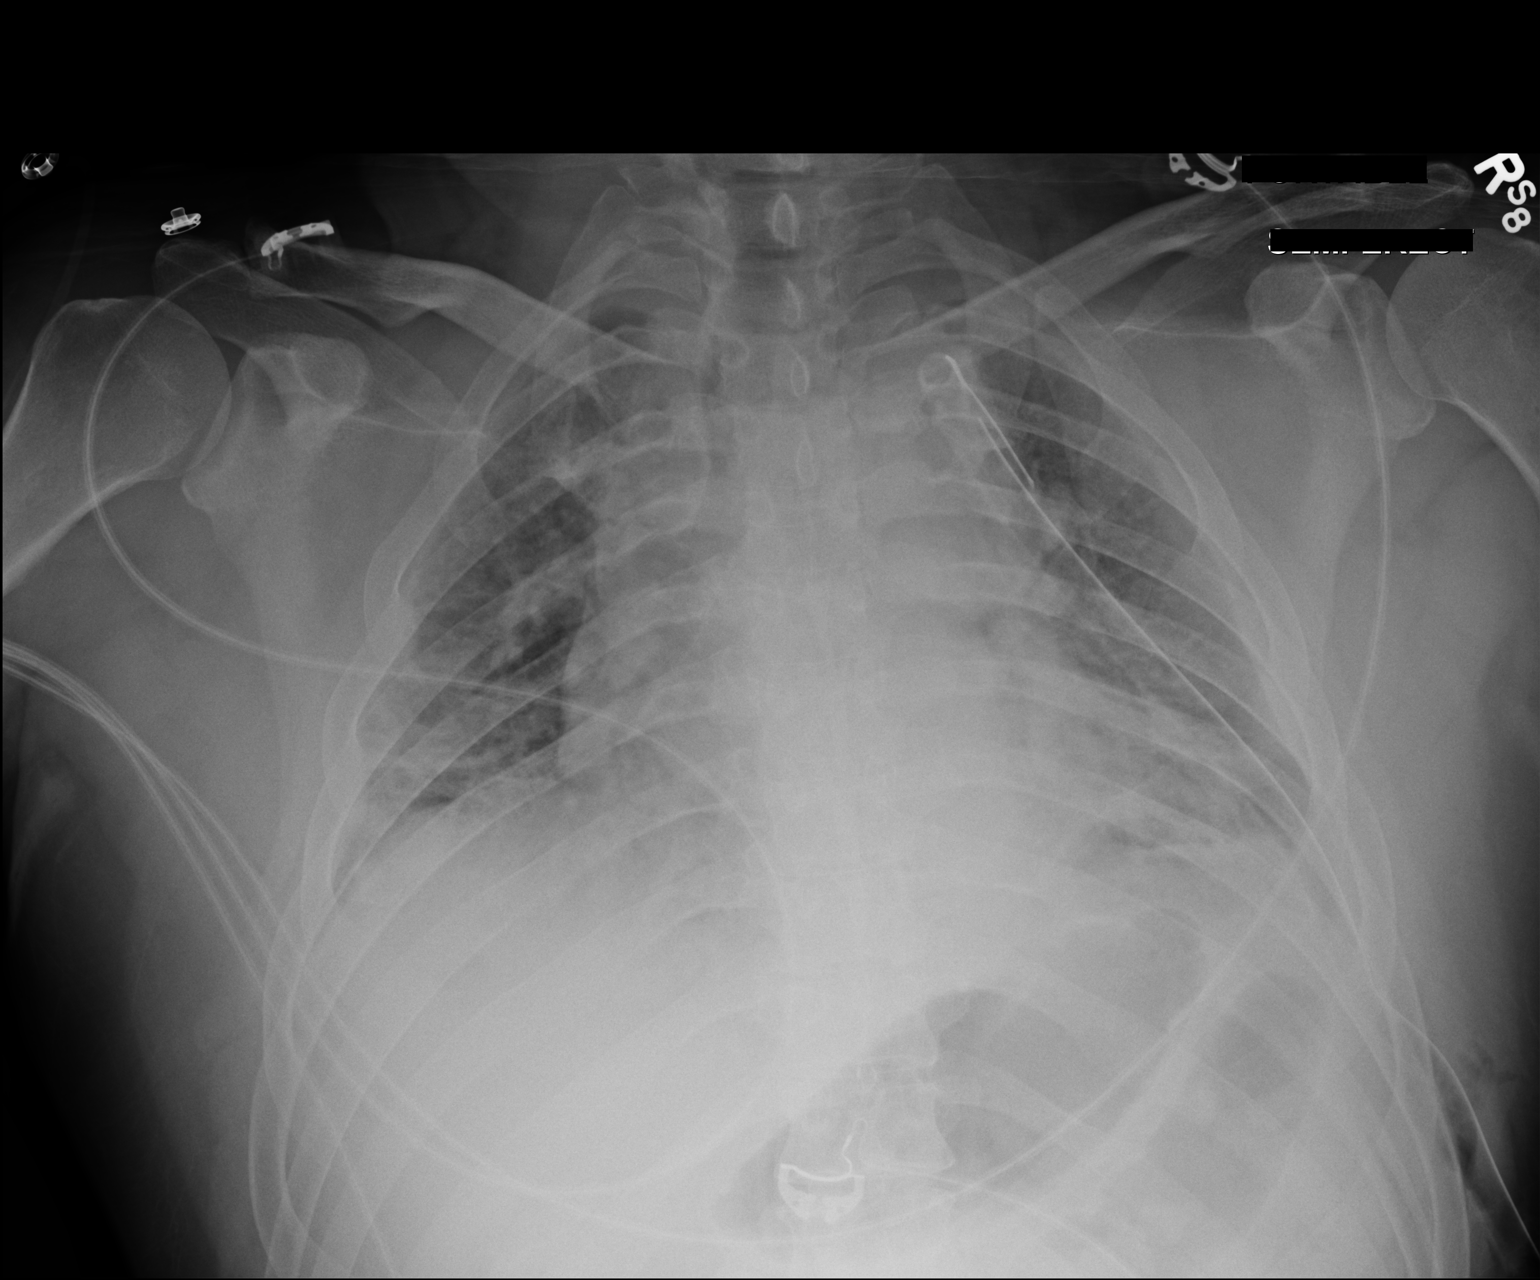

[1 of 1 positions shown; findings below may reference images not displayed]

FINDINGS: Left-sided chest tube is in place. No evidence for pneumothorax.
There is left-sided pleural thickening or small pleural effusion.
The heart is enlarged. There are bilateral changes of mild pulmonary
edema. Left basilar atelectasis or early consolidation noted.
IMPRESSION: 1. Interval placement of left-sided chest tube.  No pneumothorax.
2. Cardiomegaly and mild edema.
3. Left lower lobe atelectasis or early infiltrate.

## 2018-01-05 IMAGING — CR DG CHEST 1V PORT
1 series · 1 of 1 positions shown · non-contrast
Comparison: Portable chest x-ray April 16, 2015

CLINICAL DATA: Lung blebs, history of pneumothorax on the left

EXAM:
PORTABLE CHEST 1 VIEW

[AP]
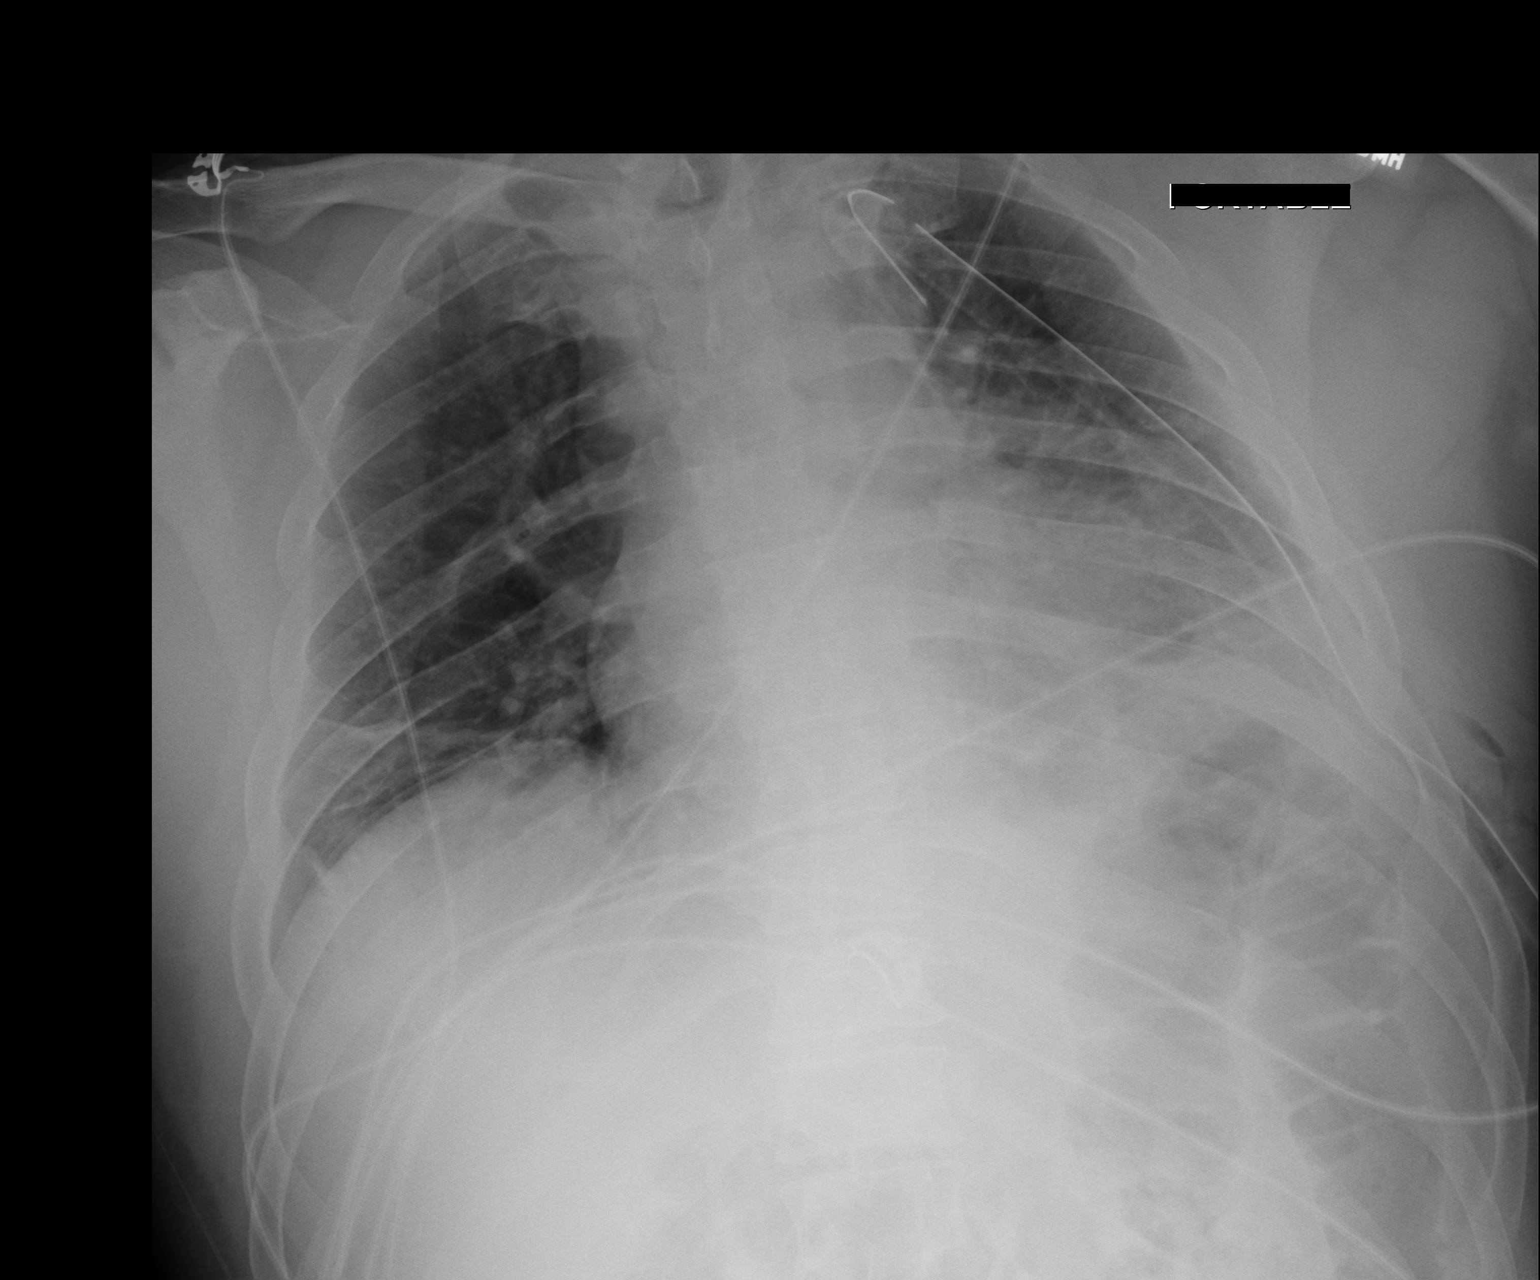

[1 of 1 positions shown; findings below may reference images not displayed]

FINDINGS: No pneumothorax is evident on the left. The chest tube is unchanged
in position with the tip projecting between the posterior aspects of
the fourth and fifth ribs. There is subsegmental atelectasis at the
left lung base. There is no significant pleural effusion. There is
minimal atelectasis at the right lung base. The pulmonary
interstitial markings elsewhere in the right lung have improved. The
left heart border remains obscured. The pulmonary vascularity has
normalized.
IMPRESSION: Improving interstitial edema bilaterally. Persistent subsegmental
atelectasis bilaterally. There is no recurrent pneumothorax. The
chest tube is in reasonable position.

## 2018-03-27 DIAGNOSIS — J301 Allergic rhinitis due to pollen: Secondary | ICD-10-CM | POA: Diagnosis not present

## 2018-03-27 DIAGNOSIS — Z6830 Body mass index (BMI) 30.0-30.9, adult: Secondary | ICD-10-CM | POA: Diagnosis not present

## 2018-04-03 DIAGNOSIS — E118 Type 2 diabetes mellitus with unspecified complications: Secondary | ICD-10-CM | POA: Diagnosis not present

## 2018-04-03 DIAGNOSIS — J301 Allergic rhinitis due to pollen: Secondary | ICD-10-CM | POA: Diagnosis not present

## 2018-04-03 DIAGNOSIS — E785 Hyperlipidemia, unspecified: Secondary | ICD-10-CM | POA: Diagnosis not present

## 2018-04-03 DIAGNOSIS — F419 Anxiety disorder, unspecified: Secondary | ICD-10-CM | POA: Diagnosis not present

## 2021-03-12 DIAGNOSIS — J0100 Acute maxillary sinusitis, unspecified: Secondary | ICD-10-CM | POA: Diagnosis not present

## 2021-03-12 DIAGNOSIS — Z6829 Body mass index (BMI) 29.0-29.9, adult: Secondary | ICD-10-CM | POA: Diagnosis not present
